# Patient Record
Sex: Female | Born: 1980 | Race: White | Hispanic: No | Marital: Single | Smoking: Current every day smoker
Health system: Southern US, Community
[De-identification: ages and names within clinical notes are randomized; demographics above are authoritative.]

## PROBLEM LIST (undated history)

## (undated) DIAGNOSIS — Z789 Other specified health status: Secondary | ICD-10-CM

## (undated) HISTORY — PX: REDUCTION MAMMAPLASTY: SUR839

## (undated) HISTORY — PX: BREAST SURGERY: SHX581

## (undated) HISTORY — PX: TONSILLECTOMY: SUR1361

## (undated) HISTORY — PX: CHOLECYSTECTOMY: SHX55

---
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
## 2000-12-16 ENCOUNTER — Emergency Department (HOSPITAL_COMMUNITY): Admission: EM | Admit: 2000-12-16 | Discharge: 2000-12-16 | Payer: Self-pay | Admitting: *Deleted

## 2001-06-02 ENCOUNTER — Ambulatory Visit (HOSPITAL_COMMUNITY): Admission: RE | Admit: 2001-06-02 | Discharge: 2001-06-02 | Payer: Self-pay | Admitting: Obstetrics and Gynecology

## 2001-06-02 ENCOUNTER — Encounter: Payer: Self-pay | Admitting: Obstetrics and Gynecology

## 2001-07-31 ENCOUNTER — Observation Stay (HOSPITAL_COMMUNITY): Admission: EM | Admit: 2001-07-31 | Discharge: 2001-08-01 | Payer: Self-pay | Admitting: *Deleted

## 2002-05-12 ENCOUNTER — Other Ambulatory Visit: Admission: RE | Admit: 2002-05-12 | Discharge: 2002-05-12 | Payer: Self-pay | Admitting: Obstetrics and Gynecology

## 2003-10-15 ENCOUNTER — Observation Stay (HOSPITAL_COMMUNITY): Admission: AD | Admit: 2003-10-15 | Discharge: 2003-10-16 | Payer: Self-pay | Admitting: Obstetrics and Gynecology

## 2004-02-07 ENCOUNTER — Ambulatory Visit (HOSPITAL_COMMUNITY): Admission: AD | Admit: 2004-02-07 | Discharge: 2004-02-07 | Payer: Self-pay | Admitting: Obstetrics and Gynecology

## 2004-03-10 ENCOUNTER — Inpatient Hospital Stay (HOSPITAL_COMMUNITY): Admission: AD | Admit: 2004-03-10 | Discharge: 2004-03-13 | Payer: Self-pay | Admitting: Obstetrics & Gynecology

## 2005-04-12 ENCOUNTER — Inpatient Hospital Stay (HOSPITAL_COMMUNITY): Admission: EM | Admit: 2005-04-12 | Discharge: 2005-04-14 | Payer: Self-pay | Admitting: Emergency Medicine

## 2005-04-13 ENCOUNTER — Encounter (INDEPENDENT_AMBULATORY_CARE_PROVIDER_SITE_OTHER): Payer: Self-pay | Admitting: General Surgery

## 2006-06-06 ENCOUNTER — Inpatient Hospital Stay (HOSPITAL_COMMUNITY): Admission: AD | Admit: 2006-06-06 | Discharge: 2006-06-08 | Payer: Self-pay | Admitting: Obstetrics and Gynecology

## 2006-07-21 ENCOUNTER — Inpatient Hospital Stay (HOSPITAL_COMMUNITY): Admission: AD | Admit: 2006-07-21 | Discharge: 2006-07-22 | Payer: Self-pay | Admitting: Family Medicine

## 2006-09-04 ENCOUNTER — Emergency Department (HOSPITAL_COMMUNITY): Admission: EM | Admit: 2006-09-04 | Discharge: 2006-09-04 | Payer: Self-pay | Admitting: Emergency Medicine

## 2006-09-06 ENCOUNTER — Ambulatory Visit (HOSPITAL_COMMUNITY): Admission: RE | Admit: 2006-09-06 | Discharge: 2006-09-06 | Payer: Self-pay | Admitting: Family Medicine

## 2007-01-17 ENCOUNTER — Inpatient Hospital Stay (HOSPITAL_COMMUNITY): Admission: AD | Admit: 2007-01-17 | Discharge: 2007-01-19 | Payer: Self-pay | Admitting: Family Medicine

## 2007-03-17 ENCOUNTER — Ambulatory Visit: Payer: Self-pay | Admitting: Internal Medicine

## 2007-08-09 ENCOUNTER — Emergency Department (HOSPITAL_COMMUNITY): Admission: EM | Admit: 2007-08-09 | Discharge: 2007-08-09 | Payer: Self-pay | Admitting: Emergency Medicine

## 2008-02-16 IMAGING — CR DG CHEST 2V
2 series · 2 of 2 positions shown · non-contrast
Comparison: None.

CLINICAL DATA: Syncopal episodes. 
 CHEST - 2 VIEW:

[view not recorded (1 of 2)]
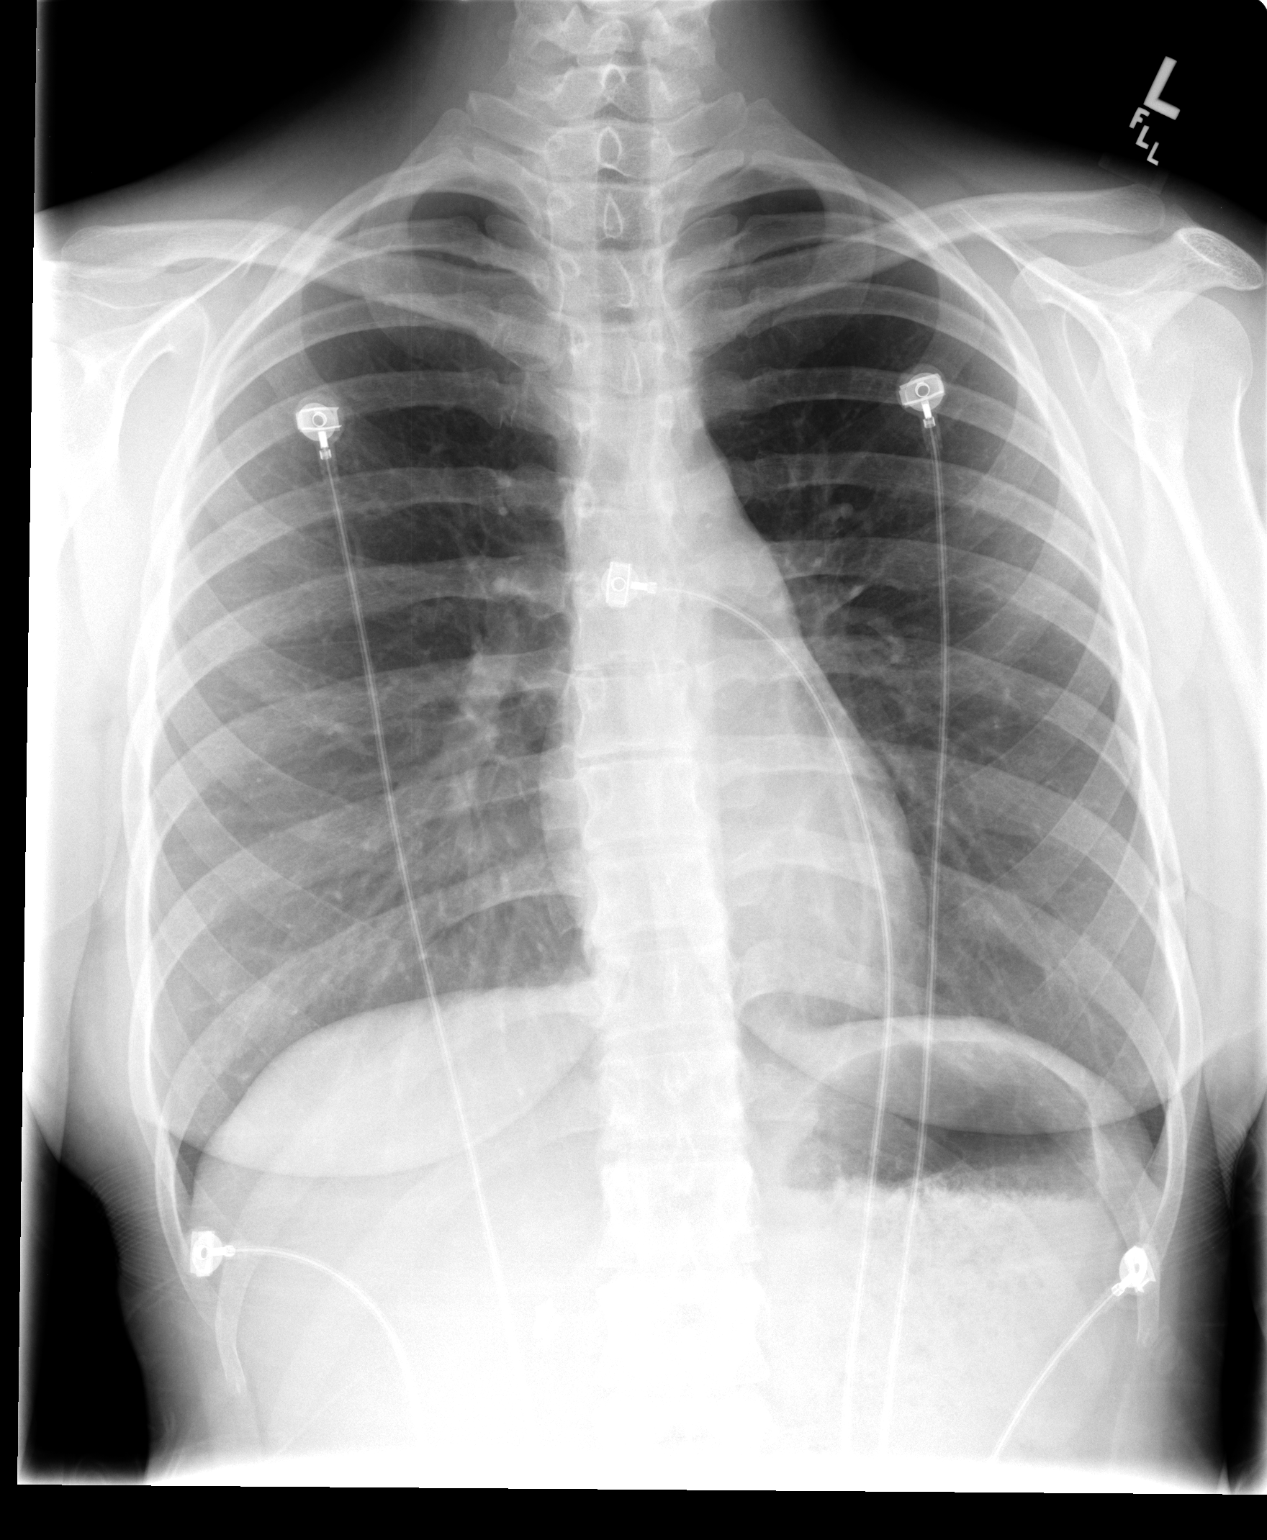

[view not recorded (2 of 2)]
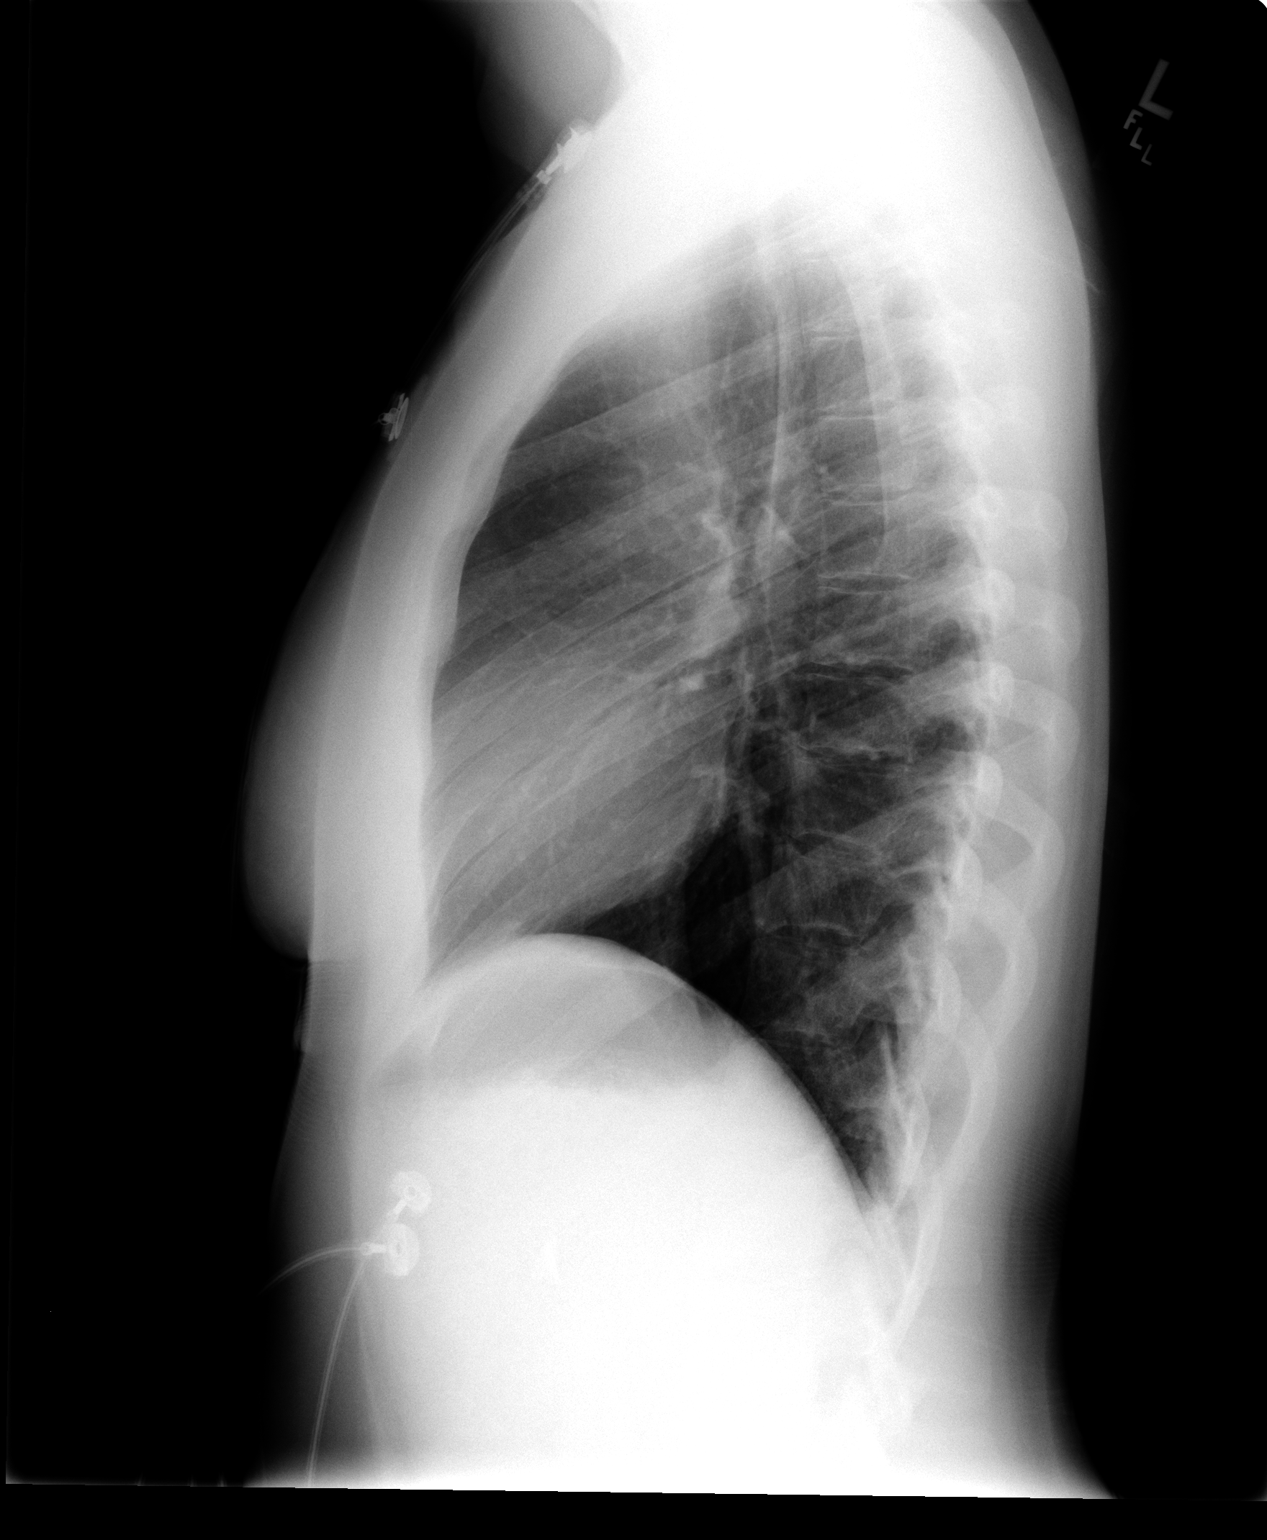

[2 of 2 positions shown; findings below may reference images not displayed]

FINDINGS: The heart size and mediastinal contours are within normal limits.  Both lungs are clear.  The visualized skeletal structures are unremarkable. Metallic clips right upper quadrant secondary to cholecystectomy.
IMPRESSION: No active cardiopulmonary disease.

## 2008-06-27 ENCOUNTER — Inpatient Hospital Stay (HOSPITAL_COMMUNITY): Admission: AD | Admit: 2008-06-27 | Discharge: 2008-06-28 | Payer: Self-pay | Admitting: Obstetrics & Gynecology

## 2008-06-27 ENCOUNTER — Ambulatory Visit: Payer: Self-pay | Admitting: Family Medicine

## 2008-09-14 ENCOUNTER — Other Ambulatory Visit: Admission: RE | Admit: 2008-09-14 | Discharge: 2008-09-14 | Payer: Self-pay | Admitting: Obstetrics and Gynecology

## 2009-10-02 ENCOUNTER — Other Ambulatory Visit: Admission: RE | Admit: 2009-10-02 | Discharge: 2009-10-02 | Payer: Self-pay | Admitting: Obstetrics & Gynecology

## 2009-11-28 ENCOUNTER — Ambulatory Visit (HOSPITAL_COMMUNITY): Admission: RE | Admit: 2009-11-28 | Discharge: 2009-11-28 | Payer: Self-pay | Admitting: Obstetrics & Gynecology

## 2010-07-06 ENCOUNTER — Emergency Department (HOSPITAL_COMMUNITY)
Admission: EM | Admit: 2010-07-06 | Discharge: 2010-07-06 | Payer: Self-pay | Source: Home / Self Care | Admitting: Family Medicine

## 2010-12-02 NOTE — Letter (Signed)
March 17, 2007    Dani Gobble, MD  [phone] [address]  [place], [place]. [address]   RE:  Aimee Webb  MRN:  [id number]  /  DOB:  1981-02-12   Dear Almira Bar,   Thank you for referring Aimee Webb for EP evaluation of syncope and  palpitations.  As you know, she is a very pleasant 30 year old woman who  has a history of tachy palpitations and is referred today for  evaluation.  The patient's history dates back to July.  She states that  she suddenly felt like her heart was racing.  There was no associated  syncope at that time, and she proceed to the emergency room.  In  transit, the heart racing eventually terminated, and she was admitted to  the hospital for evaluation under the care of Dr. Gareth Morgan.  By  report at that time, a 2D echo was carried out, which demonstrated some  LV dysfunction, which was not substantiated by stress Myoview study.  The patient subsequently had a cardiac monitor which demonstrated sinus  tachycardia at rates of up to 150 beats per minute.  She did not  describe any additional tachy palpitations.  The patient's next episode  occurred several weeks ago.  She was getting out of a car, became  diaphoretic, felt hot, and suddenly passed out.  She noted that on  awakening, she felt weak and tired and was again diaphoretic.  The  episode lasted for several minutes.  There was no loss of bowel or  bladder function.  She did not seek medical attention and eventually  felt better with her nausea resolving.  She is referred now for  additional evaluation.  Besides these two episodes, the patient had no  other specific symptoms.  She denies any heart failure complaints,  denies chest pain, or other cardiac symptoms.   PAST MEDICAL HISTORY:  Notable for a history of tonsillectomy in the  past.  She has a history of cholecystectomy in the past.  She has a  history of breast reduction in the past.  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   Her family history is notable for both parents being alive with no  significant medical problems.  Her grandfather does have heart disease.  She has one brother who is alive and well, and there is no family  history of syncope.   SOCIAL HISTORY:  The patient has a history of tobacco use and smokes a  half pack of cigarettes daily.  She denies recreational drug use.  She  does drink caffeinated beverages.   REVIEW OF SYSTEMS:  Noted in HPI.  Also, she has a history of some  dyspnea with exertion.   PHYSICAL EXAMINATION:  She is a pleasant, well-appearing 30 year old  woman in no acute distress.  Blood pressure was initially 98/67 with a heart rate of 88.  On  standing, her heart rate increased to 113 with essentially no change in  her blood pressure.  After five minutes of standing, her heart rate was  up to 118 with a blood pressure of 111/74.  She does not become dizzy or  pass out with this.  HEENT:  Normocephalic and atraumatic.  Pupils are equal and round.  Oropharynx is moist.  Sclerae are anicteric.  NECK:  No jugular venous distention.  There is no thyromegaly.  Trachea  is midline.  Carotids are 2+ and symmetric.  LUNGS:  Clear bilaterally to auscultation.  No wheezes, rales, or  rhonchi are present.  There is no increased work of breathing.  CARDIOVASCULAR:  Regular rate and rhythm with a normal S1 and S2.  There  are no murmurs, rubs or gallops.  PMI was not enlarged or laterally  displaced.  ABDOMEN:  Soft, nontender, nondistended.  There is no organomegaly.  The  bowel sounds are present with no rebound or guarding.  EXTREMITIES:  No clubbing, cyanosis or edema.  Pulses are 2+ and  symmetric.  NEUROLOGIC:  Alert and oriented x3.  Cranial nerves II-XII intact.  Strength is 5/5 and symmetric.   EKG demonstrated sinus tachycardia, otherwise normal.   [name], I think the most likely explantation of the patient's symptoms is  that she  has neurally mediated syncope.  She may also have a  supraventricular tachycardia.  The abnormal echo was a bit of a red  herring.  There is certainly no evidence on my exam that she has left  ventricular dysfunction or any heart failure symptoms, although I cannot  completely exclude a subclinical case of cardiomyopathy.  Rather, to  this end, I would recommend that we follow up her 2D echo in several  months.  With regard to her single episode of syncope, I have  recommended that she increase her salt and fluid intake.  I also tried  to educate she and her family about the importance of lying or sitting  down should she have an episode.  With regard to the tachycardic  palpitations, which she had only really one isolated episode, she may  well have supraventricular tachycardia in addition to neurally mediated  syncope, although I suspect that sinus tachycardia is the most likely  explantation to explain her palpitations, which often occur in patients  just prior to their passing out.  Her orthostatic vitals in my office  today further corroborate this, as she had an increase in her heart rate  of 30 beats per minute, going from the lying to the standing position at  five minutes.  I have recommended the patient in addition to increased  sodium and fluid intake, come back to the office and see me back in 2-3  months.  If at that time, she is doing well, then I will turn her back  over to your care and not require additional followup.  The exception of  this would be if she had what sounds more like supraventricular  tachycardia.   Thank you again for referring Aimee Webb for EP evaluation.    Sincerely,      Doylene Canning. Ladona Ridgel, MD  Electronically Signed    GWT/MedQ  DD: 03/17/2007  DT: 03/18/2007  Job #: [id number]

## 2010-12-02 NOTE — Group Therapy Note (Signed)
Aimee Webb, Aimee Webb               ACCOUNT NO.:  [id number]   MEDICAL RECORD NO.:  [id number]          PATIENT TYPE:  INP   LOCATION:  A216                          FACILITY:  [facility]   PHYSICIAN:  Aimee Webb, M.D.DATE OF BIRTH:  [birth year]   DATE OF PROCEDURE:  DATE OF DISCHARGE:                                 PROGRESS NOTE   SUBJECTIVE:  The patient feels better, but had trouble sleeping last  night.  There has been no more palpitations.   OBJECTIVE:  Temperature 97.8, pulse 84, respiratory rate of 18, blood  pressure 123/61.  On admission postural signs showed a supine blood  pressure 96/63 with a pulse of 76; a sitting blood pressure 107/61, the  pulse of 82; a standing blood pressure 104/61 with a pulse of 97.  Today  she is oriented and alert.  No acute distress.  Well-developed, well-  nourished.  Color is good.  HEART:  Irregular rhythm, rate of about 70.  LUNGS:  Clear throughout, moving air well.  There is no edema.   Her admission CBC, CMP and TSH were normal.   ASSESSMENT:  1. Tachycardia/palpitations, question etiology.  2. Orthostatic hypotension.   PLAN:  EKG reviewed and essentially normal.  Due to see Cardiologist  today.  Again, chief reason for admission is that the patient had  tachycardia and symptoms of pallor, not only when standing but also  supine, so it was felt that she really did have a basic tachycardia,  like a PAT, causing symptomatology.  She collapsed several times with  this was, so it was felt that making a diagnosis was imperative to avoid  further injury.      Aimee Webb, M.D.  Electronically Signed     SDK/MEDQ  D:  01/18/2007  T:  01/18/2007  Job:  [id number]

## 2010-12-02 NOTE — Procedures (Signed)
Aimee Webb, TESSMER               ACCOUNT NO.:  [id number]   MEDICAL RECORD NO.:  [id number]          PATIENT TYPE:  INP   LOCATION:  A216                          FACILITY:  [facility]   PHYSICIAN:  Dani Gobble, MD       DATE OF BIRTH:  March 14, 1981   DATE OF PROCEDURE:  01/19/2007  DATE OF DISCHARGE:  01/19/2007                                ECHOCARDIOGRAM   REFERRING PHYSICIAN:  Mila Homer. Sudie Bailey, M.D.   INDICATIONS:  30 year old female with a history of severe presyncope and  palpitations.   Technical aspect of study is reasonable.   The aorta measures normally at 2.4 cm.   Left atrium also measures normally at 2.8 cm.   The interventricular septum posterior wall within normal limits and  thickness, measured at 1.0 cm for each.   The aortic valve is trileaflet with normal opening.  No aortic  insufficiency is noted.  Doppler interrogation aortic valve is within  normal limits.   The mitral valve appears mildly thickened particularly on the distal  portion anterior leaflet.  No mitral valve prolapse is noted.  No  significant mitral regurgitation is noted.  Doppler interrogation mitral  valve is within normal limits.   Pulmonic valve is not well visualized.   Tricuspid valve appears grossly structurally normal with mild tricuspid  regurgitation noted.   Left ventricle is normal in size with LV IDD measured 3.7 cm, LV IC  measured 3.0 cm.  Overall left ventricular systolic function is  moderately diminished with an estimated ejection fraction of 35-45%.  There appears to be moderate global hypokinesis.  There does not appear  to be regionality to this dysfunction.   The right atrium, right ventricle normal in size, however, right  ventricular systolic function was not well evaluated.   IMPRESSION:  1. Mild tricuspid regurgitation.  2. Mildly thickened mitral valve particularly on the distal portion      anterior leaflet.  3. Normal left ventricular size with moderate  decrease in left      ventricular systolic function with EF estimated 35-45% with      moderate global hypokinesis.           ______________________________  Dani Gobble, MD     AB/MEDQ  D:  01/19/2007  T:  01/20/2007  Job:  [id number]   cc:   Mila Homer. Sudie Bailey, M.D.  Fax: [phone]

## 2010-12-02 NOTE — Op Note (Signed)
Aimee Webb, Aimee Webb               ACCOUNT NO.:  [id number]   MEDICAL RECORD NO.:  [id number]          PATIENT TYPE:  INP   LOCATION:  9109                          FACILITY:  [facility]   PHYSICIAN:  Tanya S. Shawnie Pons, M.D.   DATE OF BIRTH:  Feb 06, 1981   DATE OF PROCEDURE:  06/28/2008  DATE OF DISCHARGE:  06/28/2008                               OPERATIVE REPORT   PREOPERATIVE DIAGNOSES:  Multiparity and undesired fertility.   POSTOPERATIVE DIAGNOSES:  Multiparity and undesired fertility.   PROCEDURE:  Postpartum bilateral tubal ligation with Filshie clips.   SURGEON:  Shelbie Proctor. Shawnie Pons, MD   ASSISTANT:  None.   ANESTHESIA:  Epidural and local.   FINDINGS:  Normal tubes.   ESTIMATED BLOOD LOSS:  25 mL.   COMPLICATIONS:  None known.   REASON FOR PROCEDURE:  Briefly, the patient is a 30 year old para 3 who  desired permanent sterility.  She was counseled regarding risks and  benefits of this procedure, prior to proceed including permanency of the  procedure, risk of failure 1/100, and increased risk of ectopic.  The  patient understood these risks and agreed to proceed.   PROCEDURE IN DETAIL:  The patient was taken to the OR.  She was placed  in supine position.  She was prepped and draped in the usual sterile  fashion.  When anesthesia was felt to be adequate, a 1.5-cm incision was  made infraumbilically with a knife and was carried down to the  underlying fascia, and the peritoneal cavity was entered sharply.  The  patient was placed in Trendelenburg.  The patient's left tube was  identified, grasped with the Babcock clamp, and followed to its  fimbriated end.  A Filshie clip was placed approximately 2 cm from the  cornua under direct visualization.  The tube was allowed to return to  the abdominal cavity.  The patient's right tube was similarly found,  grasped with the Babcock clamp, and followed to its fimbriated end.  A  Filshie clip was placed across this tube, 2 cm from the  cornua under  direct visualization as well.  The ovary and the broad ligament was  noted underneath the tube that was felt to be hemostatic.  The tube was  allowed to return to the abdominal cavity.  The  fascia was then closed with 0 Vicryl suture in a running fashion and  subcuticular 3-0 Vicryl running was used to close the skin.  All  instrument and lap counts were correct x2.  The patient was awakened and  taken to recovery room in stable condition.      Shelbie Proctor. Shawnie Pons, M.D.  Electronically Signed     TSP/MEDQ  D:  06/28/2008  T:  06/28/2008  Job:  [id number]

## 2010-12-02 NOTE — Discharge Summary (Signed)
NAMEVERENIS, NICOSIA               ACCOUNT NO.:  [id number]   MEDICAL RECORD NO.:  [id number]          PATIENT TYPE:  INP   LOCATION:  A216                          FACILITY:  [facility]   PHYSICIAN:  Mila Homer. Sudie Bailey, M.D.DATE OF BIRTH:  1981/01/16   DATE OF ADMISSION:  01/17/2007  DATE OF DISCHARGE:  07/02/2008LH                               DISCHARGE SUMMARY   This 30 year old was admitted with a syncopal episode.  She had a benign  three-day hospitalization, extending from January 17, 2007, to January 19, 2007.  Vital signs remained stable during that time.   Her admission CBC and Met 7 were normal.  Electrocardiogram showed a  short PR interval.   She was seen by cardiology, Dr. Ermalinda Memos, who agreed that she probably  does have a cardiac arrhythmia, causing her symptomatology.   Arrangements were made at discharge for her to be seen by a cardiologist  and a 30-day event recorder placed and Dr. Ermalinda Memos was going to discuss  with her using the Valsalva maneuver until the rest of the data were  back.   FINAL DISCHARGE DIAGNOSES:  1. Syncopal episode with collapse.  2. Probable cardiac arrhythmia.      Mila Homer. Sudie Bailey, M.D.  Electronically Signed     SDK/MEDQ  D:  01/19/2007  T:  01/19/2007  Job:  [id number]

## 2010-12-02 NOTE — H&P (Signed)
NAMEMARILYNNE, Webb               ACCOUNT NO.:  [id number]   MEDICAL RECORD NO.:  [id number]          PATIENT TYPE:  INP   LOCATION:  A216                          FACILITY:  [facility]   PHYSICIAN:  Mila Homer. Sudie Bailey, M.D.DATE OF BIRTH:  Mar 11, 1981   DATE OF ADMISSION:  01/17/2007  DATE OF DISCHARGE:  LH                              HISTORY & PHYSICAL   This 30 year old was brought to the office today by her mother after she  was found collapsed on the floor of their kitchen.  Her mom, who is  trained as a CNA, found that she had a blood pressure of 54/32 with a of  pulse 54 and she was severely weakened, the lips and face had turned  white, she did not have the energy to get off the floor.   Apparently this has now happened four times.  In the nighttime she felt  weak and called her mom and mom found her just lying there, but  tachycardic.  Other times has had mostly standing up, and this time she  became suddenly so fatigued and weak that she had to lie down.   Currently is a single mother with two kids, ages 40 and 7 months.  Apparently there is a child custody case coming up.  In addition, there  have been problems with the patient losing weight, although she has not  been trying to.   She has generally been healthy.  She became hypokalemic last winter  after a diarrheal illness.  She was treated in the hospital for this.  She complains she is fatigued, legs feel weak, she aches and hurts all  over, and this has gone on for some time.  She notes each time she has  an attack it feels like her heart is beating out of her chest.   During both pregnancies, she stopped smoking.  Her last pregnancy  resulted in the delivery of a normal child in November 2007.  Since  then, she has started smoking again, but no more than two or three  cigarettes a day.  She does not feel depressed.   She has had no chest pain.  She will be short of breath during the  periods of palpitations but  otherwise has no known problems, she is not  coughing up sputum.  She has had no constipation or diarrhea or urinary  symptoms.   She is on no medications.  She ALLERGIC TO CODEINE, which causes nausea,  and PENICILLIN, which causes nausea.   PHYSICAL EXAMINATION IN THE OFFICE TODAY:  GENERAL:  A well-developed,  well-nourished, young woman.  She was oriented, alert, and in no acute  distress.  VITAL SIGNS:  Her weight is 124.5 pounds, down 9.5 pounds from January  and down 11.5 pounds from February.  Her blood pressure is 104/62, the  pulse is 92.  HEENT:  Her mucous membranes appear to be moist.  NECK:  There is no axillary or supraclavicular adenopathy.  HEART:  Regular rhythm and rate of 80.  LUNGS:  Clear throughout, she was moving air well.  There was  no  intercostal retraction or use of accessory muscles of respiration.  ABDOMEN:  Soft without hepatosplenomegaly or mass.  There was no CVA or  flank pain.  EXTREMITIES:  There is no edema of the ankles.   ADMISSION DIAGNOSES:  1. Syncope with near collapse.  2. Palpitations.  3. Tobacco use disorder.  4. Anxiety secondary to a custody dispute.   Given the fact this has now happened four times and each time she is  tachycardic without knowingly being stressed and as a result of it in  hypotension as checked by a trained and qualified observer, I am  admitting her to the hospital.  We are going to put her on a monitor and  have Cardiology see her to make sure she is not having any PAT or either  a cardiac arrhythmia.  I have discussed this with the patient and her  mom.      Mila Homer. Sudie Bailey, M.D.  Electronically Signed     SDK/MEDQ  D:  01/17/2007  T:  01/17/2007  Job:  [id number]

## 2010-12-05 NOTE — H&P (Signed)
[facility]  Patient:    Aimee Webb, Aimee Webb Visit Number: [id number] MRN: [id number]          Service Type: OBV Location: 3A A301 01 Attending Physician:  Aimee Webb Dictated by:   Aimee Webb, M.D. Admit Date:  07/31/2001                           History and Physical  OBSERVATION  REASON FOR OBSERVATION:  Dehydration.  HISTORY OF PRESENT ILLNESS:  Aimee Webb is a 30 year old who has had a recent problem with a relationship and has been depressed.  According to her mother, she has not eaten or drank, she says, anything for nine days.  She was seen by Aimee Webb, started on Paxil and Xanax, but it seems to have made very little difference.  She has not been active.  She has simply been lying in bed.  She was brought to the emergency room for evaluation and was evaluated by the ACT team, who felt that she should have inpatient treatment at the [facility] but, at this point, her mother would like her to have IV fluids overnight and be evaluated by Aimee Webb in the morning.  PAST MEDICAL HISTORY:  Essentially negative otherwise.  MEDICATIONS:  Paxil and Xanax are the only medications other than oral contraceptives.  SOCIAL HISTORY:  She lives at home.  She does not smoke.  She does not drink any alcohol.  FAMILY HISTORY:  Positive for depression.  REVIEW OF SYSTEMS:  Except as mentioned is negative.  PHYSICAL EXAMINATION:  GENERAL:  Thin, obviously depressed female who is in no acute distress.  VITAL SIGNS:  Blood pressure 120/70, pulse 80, afebrile.  HEENT:  Mucous membranes are slightly dry.  Nose and throat are clear. Tympanic membranes are intact.  NECK:  Supple.  CHEST:  Clear.  Without wheezes, rales, or rhonchi.  HEART:  Regular rhythm.  Without murmur, gallop, or rub.  ABDOMEN:  Soft.  Without masses.  EXTREMITIES:  No edema.  LABORATORY DATA:  BUN and creatinine are actually normal.  She has  ketones in her urine suggesting dehydration, as does her physical examination.  ASSESSMENT:  She is dehydrated.  She is very depressed.  PLAN:  IV fluids overnight.  Reevaluation by Aimee Webb tomorrow. Dictated by:   Aimee Webb, M.D. Attending Physician:  Aimee Webb DD:  07/31/01 TD:  07/31/01 Job: [id number]

## 2010-12-05 NOTE — Op Note (Signed)
Aimee Webb, Aimee Webb               ACCOUNT NO.:  [id number]   MEDICAL RECORD NO.:  [id number]          PATIENT TYPE:  INP   LOCATION:  LDR1                          FACILITY:  [facility]   PHYSICIAN:  Tilda Burrow, M.D. DATE OF BIRTH:  02/13/1981   DATE OF PROCEDURE:  06/06/2006  DATE OF DISCHARGE:                                PROCEDURE NOTE   TIME OF DELIVERY:  9 p.m.   LABOR SUMMARY AND DELIVERY NOTE:  Aimee Webb was admitted shortly before  noon and had Foley bulb in place until 5 p.m., at which time it was  removed.  At 7 p.m., membrane rupture was performed with cervix 3 cm,  50%, -2, with amniotomy revealing clear fluid.  She progressed rapidly  and developed enough pain to need an epidural.  She was given a fluid  bolus and progressed rapidly and was 8-9 cm quickly, but with a very  thick anterior lip.  When she sat up for the epidural, she quickly an  irresistible urge.  We had prepped the back, draped it, applied local  anesthesia, but we were unable to put the epidural in due to the rapid  progress and the bony obstruction when we first placed the Tuohy needle;  we only went about 2 cm in and never reached the epidural space.  When  we saw that she was having irresistible grunting urge, we stopped the  procedure, placed her down and let her proceed toward delivery.  She  progressed over the next 2-3 minutes, reaching crowning position,  delivering in a controlled fashion over an intact perineum, but had  laceration, median and second degree, that occurred with the delivery.  The right shoulder was slightly impacted.  The legs were flexed forward  in McRoberts position throughout the delivery and kept in that position  for the release of the mild dystocia.  The baby easily rotated because  after we could reach the posterior shoulder and spin the baby  counterclockwise to release the left arm from the introitus, released  the entire left arm, then delivered the rest of the body  over the  perineum with a second-degree laceration occurring.  The patient  tolerated the procedure well, was given Nubain post procedure with local  anesthesia.  Cord blood samples were obtained.  The infant had a loose  nuchal cord x1 that had been released prior to delivery of the fetal  body.   The placenta was delivered intact, Schultze presentation.  Amniotic  fluid was clear.  There was no meconium.  The perineum was cleansed, a  clean field prepared and then local anesthesia inserted and then a  continuous running 2-0 Vicryl used in 2-layer fashion to close the  perineum and skin irregularities were trimmed.  The patient tolerated  the procedure well and went to the recovery room in good condition.      Tilda Burrow, M.D.  Electronically Signed    JVF/MEDQ  D:  06/06/2006  T:  06/07/2006  Job:  [id number]   cc:   [facility]   Francoise Schaumann. Halm, DO,  [facility]  Fax: [phone]

## 2010-12-05 NOTE — Op Note (Signed)
NAME:  Aimee Webb, Aimee Webb                         ACCOUNT NO.:  [id number]   MEDICAL RECORD NO.:  [id number]                   PATIENT TYPE:  INP   LOCATION:  LDR1                                 FACILITY:  [facility]   PHYSICIAN:  Lazaro Arms, M.D.                DATE OF BIRTH:  1981/06/24   DATE OF PROCEDURE:  03/11/2004  DATE OF DISCHARGE:                                 OPERATIVE REPORT   PROCEDURE:  Epidural placement.   Tawanna is a 30 year old white female, gravida 1, para 0, at 39-6/[redacted] weeks  gestation, who had a Foley bulb placed last night and a Pitocin induction,  and she is contracting regularly and has already gotten 1 dose of IV pain  medicine.  The patient is requesting an epidural be placed.   The patient is placed in a sitting position.  The L3-L4 interspace  identified, Betadine prep is used.  Lidocaine 1% is used as a local  anesthetic.  A 17 gauge Tuohy needle was used in and a loss of resistance  technique employed, and the epidural space is found without difficulty.  Then 10 mL of 0.125% bupivacaine plain is given as a test dose without ill  effects.  An additional 10 mL of 0.125% bupivacaine is given after the  epidural catheter is threaded to dose up the epidural.  It is taped down 5  cm into the epidural space.  The patient tolerated it well with no ill  effects.  It is taped down securely on her back, and a continuous infusion  of 0.125% bupivacaine with 2 mcg/mL of fentanyl was begun at 12 mL/hr.  The  patient is getting comfortable, and fetal heart rate tracing is stable.      ___________________________________________                                            Lazaro Arms, M.D.   LHE/MEDQ  D:  03/11/2004  T:  03/11/2004  Job:  [id number]

## 2010-12-05 NOTE — Group Therapy Note (Signed)
Aimee Webb, Aimee Webb               ACCOUNT NO.:  [id number]   MEDICAL RECORD NO.:  [id number]          PATIENT TYPE:  EMS   LOCATION:  ED                            FACILITY:  [facility]   PHYSICIAN:  Mila Homer. Sudie Bailey, M.D.DATE OF BIRTH:  [birth year]   DATE OF PROCEDURE:  DATE OF DISCHARGE:                                 PROGRESS NOTE   The patient became quite febrile with a bad sore throat, coughing all  night over the last couple of days. She had not had a flu shot. Both  children have also been sick. She was recently hospitalized with a viral  illness as well.   She says that she has had no bad taste in her mouth, no clear rhinorrhea  but just was coughing last night, that kept her up. She has had some  achiness in her bones.   OBJECTIVE:  GENERAL: She is mildly toxic. Temperature is 99.3, the blood  pressure 114/75, pulse 123, respiratory rate 20.  HEENT: The tympanic membranes are absolutely gray. The pharynx appears  normal. There are negative anterior nodes. No maxillary or frontal sinus  pain.  LUNGS: Are clear throughout moving air well without intercostal  retractions or use of accessory muscles of respiration.  HEART: Regular rhythm at about 80.   The influenza A and B tests and nasal washings were negative. White cell  count was 11,100 or which 83% neutrophils, 11 lymphs, H&H is 13.4/40.6,  MCV of 87.   ASSESSMENT:  Bronchitis/pharyngitis.   PLAN:  Levaquin 500 mg daily x10 days (#10, no refills); Ibuprofen 800  mg t.i.d. for aching (#30, no refills).  A quick test for strep is pending. Discussed with patient.      Mila Homer. Sudie Bailey, M.D.  Electronically Signed     SDK/MEDQ  D:  09/04/2006  T:  09/04/2006  Job:  [id number]

## 2010-12-05 NOTE — Consult Note (Signed)
NAMERENADA, CRONIN               ACCOUNT NO.:  [id number]   MEDICAL RECORD NO.:  [id number]          PATIENT TYPE:  INP   LOCATION:  A318                          FACILITY:  [facility]   PHYSICIAN:  Barbaraann Barthel, M.D. DATE OF BIRTH:  02/03/1981   DATE OF CONSULTATION:  04/12/2005  DATE OF DISCHARGE:                                   CONSULTATION   SURGICAL CONSULTATION AND ADMISSION HISTORY AND PHYSICAL:  Note, surgery was  asked to see this 30 year old white female for abdominal pain.   CHIEF COMPLAINT:  Abdominal pain with nausea and vomiting.   HISTORY OF PRESENT MEDICAL ILLNESS:  The patient stated that she had  postprandial right upper quadrant pain radiating to her back accompanied by  nausea and vomiting approximately a week ago.  This subsided and the very  same symptoms reoccurred today, at approximately noon, after lunch.  This  was accompanied with several bouts, at least 5 bouts, of nausea and  vomiting.  She was admitted to the emergency room where she was evaluated  and a CT scan was performed and this revealed some edema around the  gallbladder by CT scan.  Sonogram was not performed.  Liver function studies  and lipase were within normal limits.  Her white count was 21,000 with 80%  neutrophils noted.  Her electrolytes and, as stated, her liver function  studies were within normal limits.   PHYSICAL EXAMINATION:  Discloses a pleasant 30 year old white female who is  uncomfortable but in no acute distress.  She is approximately 5 feet 6  inches tall, she weighs 130 pounds.  Her temperature is 97.9, pulse is 72  per minute, respirations 18 and her blood pressure was 111/64.   PHYSICAL EXAMINATION AS FOLLOWS:  HEAD:  Is normocephalic.  EYES:  Extraocular movements are intact.  Pupils are round and react to  light and accommodation.  There is no conjunctive pallor or scleral  injection.  Sclerae are normal tincture.  NOSE AND ORAL MUCOSA:  Are moist at present.  She  has been hydrated somewhat  in the emergency room.  NECK:  Is supple and cylindrical.  There is no jugular venous distention,  thyromegaly or tracheal deviation or any adenopathy.  CHEST:  Clear both to anterior and posterior auscultation.  HEART:  Regular rhythm.  LUNGS:  Are clear both to anterior and posterior auscultation.  BREASTS AND AXILLA:  Are without masses.  The patient has had a previous  breast reduction surgery.  ABDOMEN:  Tender with guarding in the right upper quadrant, bowel sounds are  present.  The patient has no femoral or inguinal hernias.  RECTAL EXAMINATION:  Guaiac negative stools.  EXTREMITIES:  Are within normal limits.   REVIEW OF SYSTEMS:  GI SYSTEM:  History of recurrent right upper quadrant  postprandial pain with nausea and vomiting.  No past history of hepatitis,  no past history of bright red rectal bleeding or black tarry stools,  although she has had some problems with hemorrhoids in the past.  She  suffers from some constipation.  She has no unexplained weight loss.  OB  HISTORY:  She is a gravida 1, para 1, abortus 0, cesarean 0 female whose  child is 25 months old.  Her last menstrual period was two weeks ago.  Her  hCG pregnancy test in the emergency room was negative.  CARDIORESPIRATORY  SYSTEM:  Within normal limits.  The patient occasionally smokes a cigarette  or two, she states.  No history of alcohol abuse.  GU SYSTEM:  No dysuria,  no past history of nephrolithiasis.  Her urinalysis was grossly within  normal limits.  ENDOCRINE SYSTEM:  No history of diabetes or thyroid  disease.  MUSCULOSKELETAL SYSTEM:  Within normal limits.  She did undergo a  breast reduction because of chronic back pain relating to her large breasts.   PAST SURGERIES:  Included breast reduction and a tonsillectomy, both  performed when she was around 30 years old.   She takes no medications on a regular basis, SHE IS ALLERGIC TO CODEINE.   Impression, therefore:   1.  Acute cholecystitis.  2.  Dehydration secondary to nausea and vomiting due to #1.   PLAN:  We will obtain a sonogram first thing in the morning.  She is n.p.o.  She has been placed on antibiotics and hydration has been initiated and we  will plan for surgery during this admission.   We discussed laparoscopic and open cholecystectomy with her in detail, her  mother has been a patient of mine and I had to perform an open  cholecystectomy on her.  All questions were answered.  We discussed  complications not limited to, but including, bleeding, infection, damage to  bile ducts, perforation of organs and transitory diarrhea and the  possibility that open surgery may be required.  Informed consent was  obtained.      Barbaraann Barthel, M.D.  Electronically Signed     WB/MEDQ  D:  04/12/2005  T:  04/13/2005  Job:  [id number]   cc:   Chart

## 2010-12-05 NOTE — Op Note (Signed)
Aimee Webb, Aimee Webb               ACCOUNT NO.:  [id number]   MEDICAL RECORD NO.:  [id number]          PATIENT TYPE:  INP   LOCATION:  A318                          FACILITY:  [facility]   PHYSICIAN:  Barbaraann Barthel, M.D. DATE OF BIRTH:  [birth year]   DATE OF PROCEDURE:  04/13/2005  DATE OF DISCHARGE:  04/14/2005                                 OPERATIVE REPORT   This is the second dictation for as the first dictation was apparently lost  by the computer system.   DIAGNOSES:  1.  Acute cholecystitis.  2.  Gallbladder with gravel.   PROCEDURE:  Laparoscopic cholecystectomy.   SURGEON:  Barbaraann Barthel, M.D.   This is a 30 year old white female who was admitted with nausea, vomiting,  and right upper quadrant pain. She was also dehydrated with an elevated  white count. Hydration and  antibiotics were initiated. She was taken to  surgery after this was corrected.   GROSS OPERATIVE FINDINGS:  Consistent with acute cholecystitis with some  small amount of adhesions and edema.   TECHNIQUE:  The patient was placed in a supine position. After, the adequate  administration of general anesthesia via endotracheal intubation, her entire  abdomen was prepped with Betadine solution and draped in the usual manner.  With the patient in Trendelenburg, a periumbilical incision was carried out  on the superior aspect of the umbilicus. A Veress needle was inserted. After  elevating the fascia and confirming its position with a saline drop test,  the abdomen was then insufflated with approximately 3.5 liters of CO2. An 11-  mm cannula was then placed through the umbilicus incision, and then under  direct vision another 11-mm cannula was placed in the epigastrium, and two 5-  mm cannulas were placed in the right upper quadrant laterally. The  gallbladder was grasped. Its adhesions were taken down. The cystic duct was  clearly visualized, triply silver clipped, and divided as was the cystic  artery.  The gallbladder was then removed uneventfully using the [facility] cautery  device and exited from the epigastric incision in the EndoSac device.  I  placed Surgicel within the liver bed and elected to place a Jackson-Pratt  drain. This drain exited through the lateral-most incision of the 5-mm  cannula site. After checking for hemostasis and irrigating, the abdomen was  then desufflated, and the fascia was closed in the area of the epigastrium  and the umbilicus with O Polysorb sutures, and 0.5% Sensorcaine was used to  anesthetize all incision sites for postoperative comfort. The  drain was sutured in place with 3-0 nylon, and the other incisions were  closed with a stapling device. Prior to closure, all sponge, needle, and  instrument counts were found to be correct. Estimated blood loss was  minimal. The patient received 1300 cc of crystalloid intraoperatively. There  were no complications.      Barbaraann Barthel, M.D.  Electronically Signed     WB/MEDQ  D:  05/04/2005  T:  05/04/2005  Job:  [id number]

## 2010-12-05 NOTE — Discharge Summary (Signed)
[name], Aimee Webb               ACCOUNT NO.:  [id number]   MEDICAL RECORD NO.:  [id number]          PATIENT TYPE:  INP   LOCATION:  A322                          FACILITY:  [facility]   PHYSICIAN:  Mila Homer. Sudie Bailey, M.D.DATE OF BIRTH:  May 16, 1981   DATE OF ADMISSION:  07/21/2006  DATE OF DISCHARGE:  01/03/2008LH                               DISCHARGE SUMMARY   This 30 year old was admitted to the hospital with dehydration and a  viral gastroenteritis.  She had a benign 1-1/2-day hospitalization  extending from 07/21/2006 to 07/22/2006.  Vital signs remained stable.   Her admission white cell count was 11,300 of which 93% neutrophils, 4%  lymphs and MET-7 showed a potassium of 3.2, glucose 102.  Her urine  showed a turbid colored urine but specific gravity greater than 1.030  and the rotavirus antigen was negative.   She was admitted to hospital, given IV normal saline at 1 liter in 2  hours, and 2 mL an hour for 5 hours, and 150 mL an hour.  She was given  Zofran 8 mg IV q.6 h. p.r.n. nausea and vomiting.  By the following day  she was much improved well-hydrated heart rate down to the 70s and  mucous membranes moist.  She looked much better; and she had no further  vomiting or diarrhea.   FINAL DIAGNOSIS:  1. Dehydration.  2. Viral gastroenteritis.  3. Hypokalemia.  We will replaced potassium with KCl 20 mEq b.i.d. for      5 days, #10 no refills.  She is discharged from the hospital but will be staying with her 6-year-  old and 60-year-old; both of whom were hospitalized with the same  symptoms.   FOLLOWUP:  In the office in 1 week.      Mila Homer. Sudie Bailey, M.D.  Electronically Signed     SDK/MEDQ  D:  07/22/2006  T:  07/22/2006  Job:  [id number]

## 2010-12-05 NOTE — H&P (Signed)
NAME:  Aimee Webb, Aimee Webb                   ACCOUNT NO.:  0   MEDICAL RECORD NO.:  [id number]           PATIENT TYPE:   LOCATION:                                FACILITY:  [facility]   PHYSICIAN:  Tilda Burrow, M.D. DATE OF BIRTH:  06-Nov-1980   DATE OF ADMISSION:  06/03/2006  DATE OF DISCHARGE:  LH                                HISTORY & PHYSICAL   CHIEF COMPLAINT:  Induction of labor secondary to psychosocial reasons.  Her  mother is having knee surgery 2 days before her due date and she will need  to be able to help take care of her.   HISTORY OF PRESENT ILLNESS:  Aimee Webb is a 30 year old, G2, P86 with an EDC of  June 10, 2006, based on and 18-week ultrasound placing her at 102 weeks'  gestation.  This also correlates exactly with her last menstrual period and  a first-trimester ultrasound.  She began prenatal care at approximately 6  weeks' gestation and has had regular visits since then.  Her prenatal course  has basically been uneventful.   PRENATAL LABORATORY DATA:  Blood type B positive, rubella immune.  HBsAg,  HIV, HSV, RPR, gonorrhea, chlamydia and Group B Streptococci are all  negative.  Her 1-hour GTT was normal at 80.  Blood pressures have been 100-  120s/50s-80s.  Total weight gain has been about 25 pounds with appropriate  fundal height growth.  Her Pap smear was L-SIL with negative HPV.  Her  colposcopy was within normal limits so we will repeat her Pap smear about 6  weeks after delivery.   PAST MEDICAL HISTORY:  Benign.   SURGICAL HISTORY:  Positive for gallbladder, tonsils and a breast reduction  and a cold knife conization.   ALLERGIES:  CODEINE.   MEDICATIONS:  Prenatal vitamins.   SOCIAL HISTORY:  She is single.  Father of the baby is aware of the  pregnancy, but he is uninvolved.  Her mother is very supportive.   FAMILY HISTORY:  Negative.   PAST OBSTETRICAL HISTORY:  Had a 4-hour labor in August 2005, and had a  vaginal delivery of a 7 pound 5 ounce  female over epidural anesthesia by Dr.  Despina Hidden.   PHYSICAL EXAMINATION:  HEENT:  Within normal limits.  HEART:  Regular rate and rhythm.  LUNGS:  Lungs were clear.  ABDOMEN:  Abdomen is soft and nontender.  Fundal height 38 cm.  PELVIC:  Cervical exam 1-2 cm, 50% effaced, -1 station in vertex  presentation.  EXTREMITIES:  Legs are negative.   IMPRESSION:  1. Intrauterine pregnancy at 39 weeks.  2. Elective induction of labor secondary to psychosocial reasons.  3. Cervical favorability.   PLAN:  We will admit her on the evening of June 03, 2006, with a Foley  preinduction and planned Pitocin and AROM in the morning,      Jacklyn Shell, C.N.M.      Tilda Burrow, M.D.  Electronically Signed    FC/MEDQ  D:  05/27/2006  T:  05/27/2006  Job:  [id number]   cc:  Francoise Schaumann. Milford Cage DO, FAAP  Fax: [phone]

## 2010-12-05 NOTE — Group Therapy Note (Signed)
[facility]  Patient:    Aimee Webb, SANDFORD Visit Number: [id number] MRN: [id number]          Service Type: OBV Location: 3A A301 01 Attending Physician:  John Giovanni Dictated by:   John Giovanni, M.D. Proc. Date: 08/01/01 Admit Date:  07/31/2001 Discharge Date: 08/01/2001                               Progress Note  SUBJECTIVE:  This 30 year old was admitted to the hospital yesterday with dehydration due to stress, anxiety, and depression over a failed relationship. She is now on IV fluids.  She still was not hungry.  OBJECTIVE:  She is supine today, oriented, and alert, in no acute distress, well-developed, well-nourished.  Temperature 98.4 degrees, pulse 73, respirations 20, blood pressure 112/62.  The lungs are clear through. The heart has a regular rhythm and rate at about 70.  The abdomen is soft without hepatosplenomegaly or mass.  There is no tenderness.  Affect is mildly depressed.  ASSESSMENT 1. Depression. 2. Initiation, resulting in dehydration.  PLAN:  Decrease IV fluids to 75 cc an hour.  Add Zyprexa 5 mg q.d.  Once she is able to eat and drink, will be able to discharge home and follow up as an outpatient.  Continue the Paxil 20 mg q.d. that she is already on, along with Xanax 0.5 mg q.i.d. Dictated by:   John Giovanni, M.D. Attending Physician:  John Giovanni DD:  08/01/01 TD:  08/02/01 Job: [id number]

## 2010-12-05 NOTE — H&P (Signed)
NAME:  Aimee Webb, Aimee Webb                         ACCOUNT NO.:  [id number]   MEDICAL RECORD NO.:  [id number]                   PATIENT TYPE:  OIB   LOCATION:  A416                                 FACILITY:  [facility]   PHYSICIAN:  Tilda Burrow, M.D.              DATE OF BIRTH:  1980-12-05   DATE OF ADMISSION:  10/15/2003  DATE OF DISCHARGE:                                HISTORY & PHYSICAL   REASON FOR ADMISSION:  Nausea and vomiting of pregnancy at [redacted] weeks  gestation.   HISTORY OF PRESENT ILLNESS:  Aimee Webb is a 30 year old gravida 1 para 0 with  an EDC of 824 who called me last evening stating that she had been vomiting  all day long, unable to keep anything down. She was admitted to the hospital  where it was noted she had 3+ ketones. Medical history is positive for a  heart murmur and frequent UTIs. Surgical history is positive for a breast  reduction, cryo, and tonsils.   ALLERGIES:  She is allergic to CODEINE.   MEDICATIONS:  She is taking prenatal vitamins.   PHYSICAL EXAMINATION:  VITAL SIGNS:  Temperature 97.2, pulse 75,  respirations 16, blood pressure 103/53. She has a trace of protein,  leukocytes, and 3+ ketones in her urine.   PRENATAL COURSE:  Prenatal course has been complicated by UTI and nausea and  vomiting of pregnancy. Blood type is B+, rubella is immune, hepatitis B  surface antigen negative, HIV is negative, serology is nonreactive. GC was  positive with test of cure was negative at that time.   PLAN:  We are going to admit, IV hydrate, give antiemetics, and attempt to  progress her diet as tolerated.     _____________________________________  ___________________________________________  Zerita Boers, N.M.                    Tilda Burrow, M.D.   DL/MEDQ  D:  16/04/9603  T:  10/16/2003  Job:  [id number]

## 2010-12-05 NOTE — Discharge Summary (Signed)
Aimee Webb, Aimee Webb               ACCOUNT NO.:  [id number]   MEDICAL RECORD NO.:  [id number]          PATIENT TYPE:  INP   LOCATION:  A318                          FACILITY:  [facility]   PHYSICIAN:  Barbaraann Barthel, M.D. DATE OF BIRTH:  [birth year]   DATE OF ADMISSION:  04/12/2005  DATE OF DISCHARGE:  09/26/2006LH                                 DISCHARGE SUMMARY   PROCEDURE:  Laparoscopic cholecystectomy on April 14, 2005.   HOSPITAL COURSE:  This is a 30 year old, white female who presented with  nausea and vomiting and right upper quadrant pain.  She had a CT scan which  showed some edema around the gallbladder, no stones and likewise she had  some early signs of acute inflammation by sonogram.  No stones were  appreciated.  She was taken to surgery after hydration and antibiotics were  initiated and, in fact, small gravel was found within the gallbladder.  She  had a laparoscopic cholecystectomy uneventfully on October 11, 2004.  She did  well postoperatively.  The following day she was tolerating p.o.  Her wounds  were clean.  She had no dysuria, leg pain or shortness of breath.  She was  discharged on the on postop day #1.   LABORATORY DATA:  She had a white count on admission of 21,000.  She was  quite dehydrated when she was admitted.  Her liver function studies were  within normal limits.  Prior to discharge her white count was 12.5 with H&H  of 10.6 and 31.0.  Her electrolytes were grossly within normal limits.  Her  liver function studies were also grossly within normal limits.  CT scan and  sonogram as mentioned above.   ACTIVITY:  The patient is excused from work.  She is told that she may  shower.  Increase her activity otherwise as tolerated.  No driving, no heavy  lifting or no sexual activity at present.   DISCHARGE MEDICATIONS:  Darvocet 1 tablet every 4 hours as needed.   SPECIAL INSTRUCTIONS:  No aspirin products.   FOLLOW UP:  I will follow her in my office.   Arrangements were made to see  her Tuesday, April 21, 2005, at 10:30 a.m..  For any surgical problems, she  is told to go to the emergency room in my absence or contact Dr. Lovell Sheehan who  will be covering me in my absence.      Barbaraann Barthel, M.D.  Electronically Signed    WB/MEDQ  D:  04/14/2005  T:  04/14/2005  Job:  [id number]   cc:   Dalia Heading, M.D.  Fax: [phone]

## 2010-12-05 NOTE — Op Note (Signed)
NAME:  OVA, GILLENTINE                         ACCOUNT NO.:  [id number]   MEDICAL RECORD NO.:  [id number]                   PATIENT TYPE:  INP   LOCATION:  A419                                 FACILITY:  [facility]   PHYSICIAN:  Lazaro Arms, M.D.                DATE OF BIRTH:  [birth year]   DATE OF PROCEDURE:  03/11/2004  DATE OF DISCHARGE:                                 OPERATIVE REPORT   Aimee Webb is a 31 year old white female, gravida 1, para 0. She progressed  very nicely through the first stage of labor and has been pushing now for  about 15 minutes.   Over an intact perineum, [name] delivers a viable female infant at 11:21 with  Apgars of 9 and 9, weight 7 pounds and 5 ounces. There was a 3-vessel cord.  Cord blood and cord gas were sent. Placenta was somewhat calcified but  otherwise normal and intact. The uterus was firm. There was a midline  vaginal perineal laceration that resulted that was repaired without  difficulty The uterus was firm below the umbilicus, and the bleeding is  appropriate. Blood loss from delivery is 250 cc. The infant will undergo  routine resuscitation and care.      ___________________________________________                                            Lazaro Arms, M.D.   LHE/MEDQ  D:  03/11/2004  T:  03/11/2004  Job:  [id number]

## 2010-12-05 NOTE — H&P (Signed)
NAMEHALLEL, DENHERDER               ACCOUNT NO.:  [id number]   MEDICAL RECORD NO.:  [id number]          PATIENT TYPE:  INP   LOCATION:  LDR1                          FACILITY:  [facility]   PHYSICIAN:  Tilda Burrow, M.D. DATE OF BIRTH:  10/14/1980   DATE OF ADMISSION:  06/06/2006  DATE OF DISCHARGE:  LH                              HISTORY & PHYSICAL   ADDENDUM:  Aimee Webb presents at mid day on June 06, 2006 for elective  induction of labor. She has been previously been scheduled for induction  by Dr. Cathie Beams and rescheduled due to staff being busy.  She is still requesting induction for the same psychosocial reasons and  we are attempting to accommodate her. She understands all the usual  risks of labor and management that can occur with induction. We have  discussed risks including distress for the infant, need for emergency  delivery and other complications of pregnancy. She requests and will  likely want this early. A Foley bulb has been inserted. We will leave it  in for 4 hours. Begin low dose Pitocin and do amniotomy after Foley bulb  is removed. See HPI dictated on May 27, 2006 by Drenda Freeze regarding  overall medical history. Cervix is 1 to 2, 50%, minus 1 to minus 2.  Foley bulb easily inserted. Will anticipate good prognosis for vaginal  delivery.   The patient is now 39 weeks and 3 days by menstrual and ultrasound  criteria. She had a prior induction with 4 hour labor, once Pitocin was  initiated, after Foley bulb placement overnight.      Tilda Burrow, M.D.  Electronically Signed     JVF/MEDQ  D:  06/06/2006  T:  06/06/2006  Job:  [id number]

## 2010-12-05 NOTE — H&P (Signed)
Aimee Webb, CAISSE               ACCOUNT NO.:  [id number]   MEDICAL RECORD NO.:  [id number]          PATIENT TYPE:  INP   LOCATION:  A322                          FACILITY:  [facility]   PHYSICIAN:  Mila Homer. Sudie Bailey, M.D.DATE OF BIRTH:  [birth year]   DATE OF ADMISSION:  07/21/2006  DATE OF DISCHARGE:  LH                              HISTORY & PHYSICAL   The patient is a 30 year old who developed nausea and vomiting and  diarrhea starting about 7 p.m. last night, almost 24 hours ago. She had  been unable to keep anything down. Appears to be weak and dizzy.  She is  generally healthy.  Last seen in this office about five years ago.  She  now has two children, one age 75, the other age six weeks.  Both of whom  had a viral-type disease and one required IV fluids in the [facility]  Emergency Room.   ALLERGIES:  The patient has no drug allergies.   She is an organ donor. She uses a subcutaneous estrogen delivery system  for birth control.   PHYSICAL EXAMINATION:  GENERAL:  Pleasant, young female who looks very  weak.  VITAL SIGNS:  Weight 134 pounds, height 64-1/4 inches, BP unreadable.  Pulse 130.  Rechecked by M.D. and the left radial pulse 140.  Temperature 98.  HEART:  Regular rhythm without murmur, rate of 140.  LUNGS:  Clear throughout.  ABDOMEN:  Soft without hepatosplenomegaly or mass.  Mild tenderness.  SKIN:  Skin turgor normal but mucous membranes were dry.  EXTREMITIES:  No edema of the ankles.   ADMISSION DIAGNOSES:  1. Dehydration.  2. Probably a viral gastroenteritis.   PLAN:  Direct admission to the hospital for IV fluids.  CBC, SMA12  pending as is a Rotavirus antibody from stool.  She will be given Zofran  8 mg IV q.6h. for nausea and vomiting.  No treatment for diarrhea at  present.      Mila Homer. Sudie Bailey, M.D.  Electronically Signed     SDK/MEDQ  D:  07/21/2006  T:  07/21/2006  Job:  [id number]

## 2010-12-05 NOTE — H&P (Signed)
NAME:  Aimee Webb                         ACCOUNT NO.:  [id number]   MEDICAL RECORD NO.:  [id number]                   PATIENT TYPE:  INP   LOCATION:  LDR1                                 FACILITY:  [facility]   PHYSICIAN:  Lazaro Arms, M.D.                DATE OF BIRTH:  01/02/1981   DATE OF ADMISSION:  03/10/2004  DATE OF DISCHARGE:                                HISTORY & PHYSICAL   Aimee Webb is a 30 year old white female, gravida 1, para 0, with estimated  delivery of March 12, 2004, currently at 39-5/[redacted] weeks gestation, who is  admitted for elective induction. The patient has been by her and her  mother's report miserable for the past several days, has not slept any, has  low back pain and pelvic pain. On exam in the office, she is 1 to 2, 25%, -2  station, vertex soft and posterior. We talked over the options, and the  patient is adamant about proceeding with elective induction if that is an  option. As a result, she is admitted for Foley bulb placement followed  Pitocin induction of labor tomorrow.   PAST MEDICAL HISTORY:  Multiple urinary tract infections.  1. She did have positive gonorrhea early in the pregnancy that was treated,     and repeat culture was negative at that time. She had another negative     culture at 36 weeks as well. Her group B strep culture was also negative.     Otherwise pregnancy has been uncomplicated.   PAST SURGICAL HISTORY:  She had a breast reduction.  1. She has had cryotherapy.  2. She has had tonsillectomy.   ALLERGIES:  CODEINE.   PAST OBSTETRICAL HISTORY:  She is multiparous.   REVIEW OF SYSTEMS:  Otherwise negative. Blood type is B positive. Antibody  screen is negative. Serology is nonreactive. Rubella is immune. Hepatitis B  negative. HIV is negative. Pap smear was normal. Her AFP was normal. Her HSV  titers at 28 weeks were negative. Her Glucola was barely normal at 139.   PHYSICAL EXAMINATION:  HEENT:  Unremarkable. Thyroid is  normal.  LUNGS:  Clear.  ABDOMEN:  Last fundal height in the office was 36 cm.  CERVIX:  1 to 2, 25, -2, soft, posterior vertex.  EXTREMITIES:  Warm with no edema.   IMPRESSION:  1. Intrauterine pregnancy at 39-5/[redacted] weeks gestation.  2. Elective induction.   PLAN:  The patient is admitted for Foley bulb ripening of cervix, following  with Pitocin induction of labor today. She is requesting an epidural when  she begins having regular uterine contractions.     ___________________________________________                                         Lazaro Arms, M.D.   Loraine Maple  D:  03/10/2004  T:  03/10/2004  Job:  [id number]

## 2011-01-14 ENCOUNTER — Ambulatory Visit (INDEPENDENT_AMBULATORY_CARE_PROVIDER_SITE_OTHER): Payer: Self-pay | Admitting: Internal Medicine

## 2011-04-09 LAB — RAPID URINE DRUG SCREEN, HOSP PERFORMED
Barbiturates: NOT DETECTED
Benzodiazepines: NOT DETECTED
Cocaine: NOT DETECTED
Opiates: NOT DETECTED

## 2011-04-09 LAB — DIFFERENTIAL
Basophils Absolute: 0
Eosinophils Absolute: 0.3
Eosinophils Relative: 3
Lymphocytes Relative: 38
Lymphs Abs: 4.1 — ABNORMAL HIGH
Monocytes Absolute: 0.7
Neutrophils Relative %: 52

## 2011-04-09 LAB — CBC
Hemoglobin: 12.1
MCV: 86.8
RDW: 14.6

## 2011-04-09 LAB — URINALYSIS, ROUTINE W REFLEX MICROSCOPIC
Bilirubin Urine: NEGATIVE
Nitrite: NEGATIVE
Specific Gravity, Urine: 1.02
Urobilinogen, UA: 0.2

## 2011-04-09 LAB — BASIC METABOLIC PANEL
BUN: 7
Calcium: 9.1
Chloride: 105
GFR calc Af Amer: >60

## 2011-04-09 LAB — URINE MICROSCOPIC-ADD ON

## 2011-04-09 LAB — ETHANOL: Alcohol, Ethyl (B): <5

## 2011-04-24 LAB — CBC
Hemoglobin: 10.1 g/dL — ABNORMAL LOW (ref 12.0–15.0)
MCV: 87.6 fL (ref 78.0–100.0)
Platelets: 232 10*3/uL (ref 150–400)
RBC: 3.45 MIL/uL — ABNORMAL LOW (ref 3.87–5.11)
RDW: 13.6 % (ref 11.5–15.5)

## 2011-05-05 LAB — BASIC METABOLIC PANEL
CO2: 25
Chloride: 109
Potassium: 4.2
Sodium: 140

## 2011-05-05 LAB — DIFFERENTIAL
Eosinophils Absolute: 0.2
Eosinophils Relative: 2
Lymphocytes Relative: 38
Lymphs Abs: 4 — ABNORMAL HIGH
Monocytes Absolute: 0.6
Monocytes Relative: 6
Neutro Abs: 5.7
Neutrophils Relative %: 54

## 2011-05-05 LAB — URINALYSIS, ROUTINE W REFLEX MICROSCOPIC
Bilirubin Urine: NEGATIVE
Ketones, ur: NEGATIVE
Nitrite: NEGATIVE
Protein, ur: NEGATIVE
Specific Gravity, Urine: 1.025
Urobilinogen, UA: 0.2

## 2011-05-05 LAB — LIPID PANEL
LDL Cholesterol: 103 — ABNORMAL HIGH
VLDL: 23

## 2011-05-05 LAB — RAPID URINE DRUG SCREEN, HOSP PERFORMED
Barbiturates: NOT DETECTED
Tetrahydrocannabinol: NOT DETECTED

## 2011-05-05 LAB — CBC
Platelets: 329
RDW: 15.2 — ABNORMAL HIGH
WBC: 10.5

## 2011-05-06 LAB — DIFFERENTIAL
Basophils Absolute: 0
Basophils Relative: 0
Lymphs Abs: 3.7 — ABNORMAL HIGH
Monocytes Absolute: 0.5
Monocytes Relative: 6

## 2011-05-06 LAB — TSH: TSH: 1.359

## 2011-05-06 LAB — COMPREHENSIVE METABOLIC PANEL
AST: 17
BUN: 9
Calcium: 9.5
GFR calc non Af Amer: >60
Potassium: 4
Sodium: 138

## 2011-05-06 LAB — CBC
Hemoglobin: 14.4
MCV: 83.9

## 2011-06-02 ENCOUNTER — Other Ambulatory Visit: Payer: Self-pay | Admitting: Obstetrics & Gynecology

## 2016-01-27 ENCOUNTER — Telehealth: Payer: Self-pay | Admitting: *Deleted

## 2016-01-27 NOTE — Telephone Encounter (Signed)
Needs appt to be seen before meds can be prescribed

## 2016-01-27 NOTE — Telephone Encounter (Signed)
Spoke with pt letting her know she needs to make an appt since it's been a few years since she was seen. Pt voiced understanding. JSY

## 2016-05-09 ENCOUNTER — Emergency Department (HOSPITAL_COMMUNITY)
Admission: EM | Admit: 2016-05-09 | Discharge: 2016-05-09 | Disposition: A | Discharge status: Home / Self Care | Payer: Self-pay | Attending: Emergency Medicine | Admitting: Emergency Medicine

## 2016-05-09 ENCOUNTER — Emergency Department (HOSPITAL_COMMUNITY): Payer: Self-pay

## 2016-05-09 ENCOUNTER — Encounter (HOSPITAL_COMMUNITY): Payer: Self-pay | Admitting: Emergency Medicine

## 2016-05-09 DIAGNOSIS — J329 Chronic sinusitis, unspecified: Secondary | ICD-10-CM | POA: Insufficient documentation

## 2016-05-09 DIAGNOSIS — F17210 Nicotine dependence, cigarettes, uncomplicated: Secondary | ICD-10-CM | POA: Insufficient documentation

## 2016-05-09 DIAGNOSIS — J209 Acute bronchitis, unspecified: Secondary | ICD-10-CM | POA: Insufficient documentation

## 2016-05-09 MED ORDER — CEPHALEXIN 500 MG PO CAPS
500.0000 mg | ORAL_CAPSULE | Freq: Once | ORAL | Status: AC
  Administered 2016-05-09: 500 mg via ORAL
  Filled 2016-05-09: qty 1

## 2016-05-09 MED ORDER — DEXAMETHASONE 4 MG PO TABS: 4.0000 mg | ORAL_TABLET | Freq: Two times a day (BID) | ORAL | 0 refills | Status: DC

## 2016-05-09 MED ORDER — ONDANSETRON HCL 4 MG PO TABS
4.0000 mg | ORAL_TABLET | Freq: Once | ORAL | Status: AC
  Administered 2016-05-09: 4 mg via ORAL
  Filled 2016-05-09: qty 1

## 2016-05-09 MED ORDER — CEPHALEXIN 500 MG PO CAPS: 500.0000 mg | ORAL_CAPSULE | Freq: Four times a day (QID) | ORAL | 0 refills | Status: DC

## 2016-05-09 MED ORDER — IBUPROFEN 800 MG PO TABS
800.0000 mg | ORAL_TABLET | Freq: Once | ORAL | Status: AC
  Administered 2016-05-09: 800 mg via ORAL
  Filled 2016-05-09: qty 1

## 2016-05-09 MED ORDER — LORATADINE-PSEUDOEPHEDRINE ER 5-120 MG PO TB12: 1.0000 | ORAL_TABLET | Freq: Two times a day (BID) | ORAL | 0 refills | Status: DC

## 2016-05-09 MED ORDER — PREDNISONE 20 MG PO TABS
40.0000 mg | ORAL_TABLET | Freq: Once | ORAL | Status: AC
  Administered 2016-05-09: 40 mg via ORAL
  Filled 2016-05-09: qty 2

## 2016-05-09 MED ORDER — HYDROCODONE-HOMATROPINE 5-1.5 MG/5ML PO SYRP: 5.0000 mL | ORAL_SOLUTION | Freq: Four times a day (QID) | ORAL | 0 refills | Status: DC | PRN

## 2016-05-09 MED ORDER — ALBUTEROL SULFATE HFA 108 (90 BASE) MCG/ACT IN AERS
2.0000 | INHALATION_SPRAY | Freq: Once | RESPIRATORY_TRACT | Status: AC
  Administered 2016-05-09: 2 via RESPIRATORY_TRACT
  Filled 2016-05-09: qty 6.7

## 2016-05-09 NOTE — ED Provider Notes (Signed)
[facility]EMER[facility]ENCY [facility] Provider Note   CSN: [id number] Arrival date & time: 05/09/16  1320     History   Chief Complaint Chief Complaint  Patient presents with  . Cough    HPI Aimee Webb is a 35 y.o. female.  Patient is a 35 year old female who presents to the emergency department with a complaint of cough.  Patient states that for the last 11 days she's been having problems with cough. She states this problem started with nasal congestion and cough. She had fever for one or 2 days, and then that got better. She now has a cough that is recurrent. She states that her chest is very sore as a result of her cough. She's not coughing up any blood, but she states that the phlegm changes color from time to time. She's not had any previous operations or procedures involving her chest. It is of note that she is a smoker.    Cough  Associated symptoms include myalgias.    History reviewed. No pertinent past medical history.  There are no active problems to display for this patient.   Past Surgical History:  Procedure Laterality Date  . CHOLECYSTECTOMY      OB History    Gravida Para Term Preterm AB Living   3 3 3          SAB TAB Ectopic Multiple Live Births                   Home Medications    Prior to Admission medications   Medication Sig Start Date End Date Taking? Authorizing Provider  cephALEXin (KEFLEX) 500 MG capsule Take 1 capsule (500 mg total) by mouth 4 (four) times daily. 05/09/16   Ivery Quale, PA-C  dexamethasone (DECADRON) 4 MG tablet Take 1 tablet (4 mg total) by mouth 2 (two) times daily with a meal. 05/09/16   Ivery Quale, PA-C  HYDROcodone-homatropine (HYCODAN) 5-1.5 MG/5ML syrup Take 5 mLs by mouth every 6 (six) hours as needed. 05/09/16   Ivery Quale, PA-C  loratadine-pseudoephedrine (CLARITIN-D 12 HOUR) 5-120 MG tablet Take 1 tablet by mouth 2 (two) times daily. 05/09/16   Ivery Quale, PA-C    Family History History reviewed. No  pertinent family history.  Social History Social History  Substance Use Topics  . Smoking status: Current Every Day Smoker    Packs/day: 0.25    Types: Cigarettes  . Smokeless tobacco: Never Used  . Alcohol use No     Allergies   Codeine   Review of Systems Review of Systems  HENT: Positive for congestion.   Respiratory: Positive for cough.   Musculoskeletal: Positive for myalgias.  All other systems reviewed and are negative.    Physical Exam Updated Vital Signs BP (!) 133/102 (BP Location: Left Arm)   Pulse 93   Temp 97.9 F (36.6 C) (Oral)   Resp 16   Ht 5\' 5"  (1.651 m)   Wt 63.5 kg   LMP 05/04/2016   SpO2 100%   BMI 23.30 kg/m   Physical Exam  Constitutional: She is oriented to person, place, and time. She appears well-developed and well-nourished.  Non-toxic appearance.  HENT:  Head: Normocephalic.  Right Ear: Tympanic membrane and external ear normal.  Left Ear: Tympanic membrane and external ear normal.  Eyes: EOM and lids are normal. Pupils are equal, round, and reactive to light.  Neck: Normal range of motion. Neck supple. Carotid bruit is not present.  Cardiovascular: Normal rate, regular rhythm, normal heart  sounds, intact distal pulses and normal pulses.   Pulmonary/Chest: Effort normal. No respiratory distress. She has wheezes.  Soft end expiratory wheezes present. Coarse breath sounds present throughout lung fields. There is symmetrical rise and fall of the chest.  Abdominal: Soft. Bowel sounds are normal. There is no tenderness. There is no guarding.  Musculoskeletal: Normal range of motion.  Lymphadenopathy:       Head (right side): No submandibular adenopathy present.       Head (left side): No submandibular adenopathy present.    She has no cervical adenopathy.  Neurological: She is alert and oriented to person, place, and time. She has normal strength. No cranial nerve deficit or sensory deficit.  Skin: Skin is warm and dry.  Psychiatric:  She has a normal mood and affect. Her speech is normal.  Nursing note and vitals reviewed.    ED Treatments / Results  Labs (all labs ordered are listed, but only abnormal results are displayed) Labs Reviewed - No data to display  EKG  EKG Interpretation None       Radiology Dg Chest 2 View  Result Date: 05/09/2016 CLINICAL DATA:  35 year old female with history of cough and chest congestion for the past 4 days. EXAM: CHEST  2 VIEW COMPARISON:  Chest x-ray 01/17/2007. FINDINGS: Lung volumes are normal. No consolidative airspace disease. No pleural effusions. No pneumothorax. No pulmonary nodule or mass noted. Pulmonary vasculature and the cardiomediastinal silhouette are within normal limits. IMPRESSION: No radiographic evidence of acute cardiopulmonary disease. Electronically Signed   By: Trudie Reedaniel  Entrikin M.D.   On: 05/09/2016 13:41    Procedures Procedures (including critical care time)  Medications Ordered in ED Medications  albuterol (PROVENTIL HFA;VENTOLIN HFA) 108 (90 Base) MCG/ACT inhaler 2 puff (not administered)  predniSONE (DELTASONE) tablet 40 mg (40 mg Oral Given 05/09/16 1417)  cephALEXin (KEFLEX) capsule 500 mg (500 mg Oral Given 05/09/16 1417)  ibuprofen (ADVIL,MOTRIN) tablet 800 mg (800 mg Oral Given 05/09/16 1417)  ondansetron (ZOFRAN) tablet 4 mg (4 mg Oral Given 05/09/16 1417)     Initial Impression / Assessment and Plan / ED Course  I have reviewed the triage vital signs and the nursing notes.  Pertinent labs & imaging results that were available during my care of the patient were reviewed by me and considered in my medical decision making (see chart for details).  Clinical Course    **I have reviewed nursing notes, vital signs, and all appropriate lab and imaging results for this patient.*  Final Clinical Impressions(s) / ED Diagnoses  Initial blood pressure is elevated at 133/102, the remainder the vital signs were within normal limits. The  examination favors bronchitis and sinusitis. The patient will be treated with Keflex, Decadron, albuterol, Claritin-D, and Hycodan. We will give the patient a mask to use until symptoms have resolved. We also discussed the need for good handwashing. Patient is in agreement with this discharge plan.   Repeat blood pressure at discharge was 115/75.    Final diagnoses:  Acute bronchitis, unspecified organism  Other sinusitis, unspecified chronicity    New Prescriptions New Prescriptions   CEPHALEXIN (KEFLEX) 500 MG CAPSULE    Take 1 capsule (500 mg total) by mouth 4 (four) times daily.   DEXAMETHASONE (DECADRON) 4 MG TABLET    Take 1 tablet (4 mg total) by mouth 2 (two) times daily with a meal.   HYDROCODONE-HOMATROPINE (HYCODAN) 5-1.5 MG/5ML SYRUP    Take 5 mLs by mouth every 6 (six) hours as needed.  LORATADINE-PSEUDOEPHEDRINE (CLARITIN-D 12 HOUR) 5-120 MG TABLET    Take 1 tablet by mouth 2 (two) times daily.     Ivery Quale, PA-C 05/09/16 1425    Lavera Guise, MD 05/10/16 [phone]

## 2016-05-09 NOTE — ED Notes (Signed)
Respiratory paged

## 2016-05-09 NOTE — Discharge Instructions (Signed)
Please increase fluids. Wash hands frequently. Use a mask until symptoms have resolved. Please use Decadron, Keflex, and albuterol and Claritin-D daily. Use Hycodan for cough area as medication may cause drowsiness, please use with caution.

## 2016-05-09 NOTE — ED Triage Notes (Signed)
Pt reports cough and congestion for 1 week. Pt states she had fever at first but none now.

## 2017-09-08 ENCOUNTER — Emergency Department (HOSPITAL_COMMUNITY)
Admission: EM | Admit: 2017-09-08 | Discharge: 2017-09-08 | Disposition: A | Discharge status: Home / Self Care | Payer: Self-pay | Attending: Emergency Medicine | Admitting: Emergency Medicine

## 2017-09-08 ENCOUNTER — Encounter (HOSPITAL_COMMUNITY): Payer: Self-pay | Admitting: Emergency Medicine

## 2017-09-08 ENCOUNTER — Other Ambulatory Visit: Payer: Self-pay

## 2017-09-08 DIAGNOSIS — N390 Urinary tract infection, site not specified: Secondary | ICD-10-CM | POA: Insufficient documentation

## 2017-09-08 DIAGNOSIS — M545 Low back pain: Secondary | ICD-10-CM | POA: Insufficient documentation

## 2017-09-08 DIAGNOSIS — R350 Frequency of micturition: Secondary | ICD-10-CM | POA: Insufficient documentation

## 2017-09-08 DIAGNOSIS — F17210 Nicotine dependence, cigarettes, uncomplicated: Secondary | ICD-10-CM | POA: Insufficient documentation

## 2017-09-08 LAB — URINALYSIS, ROUTINE W REFLEX MICROSCOPIC
BILIRUBIN URINE: NEGATIVE
Glucose, UA: NEGATIVE mg/dL
KETONES UR: 5 mg/dL — AB
NITRITE: NEGATIVE
PROTEIN: NEGATIVE mg/dL
Specific Gravity, Urine: 1.021 (ref 1.005–1.030)
pH: 7 (ref 5.0–8.0)

## 2017-09-08 LAB — POC URINE PREG, ED: PREG TEST UR: NEGATIVE

## 2017-09-08 MED ORDER — CEPHALEXIN 500 MG PO CAPS
500.0000 mg | ORAL_CAPSULE | Freq: Once | ORAL | Status: AC
  Administered 2017-09-08: 500 mg via ORAL
  Filled 2017-09-08: qty 1

## 2017-09-08 MED ORDER — IBUPROFEN 800 MG PO TABS
800.0000 mg | ORAL_TABLET | Freq: Once | ORAL | Status: AC
  Administered 2017-09-08: 800 mg via ORAL
  Filled 2017-09-08: qty 1

## 2017-09-08 MED ORDER — CEPHALEXIN 500 MG PO CAPS: 500.0000 mg | ORAL_CAPSULE | Freq: Four times a day (QID) | ORAL | 0 refills | Status: DC

## 2017-09-08 NOTE — ED Provider Notes (Signed)
[facility]Y [facility]PARTMENT Provider Note   CSN: [id number] Arrival date & time: 09/08/17  1858     History   Chief Complaint Chief Complaint  Patient presents with  . Abdominal Pain    HPI Aimee Webb is a 37 y.o. female.  HPI   Aimee Webb is a 37 y.o. female who presents to the Emergency Department complaining of low back pain, urinary frequency,  fever and chills.  Symptoms began last week.  She states that she felt as though she had a urinary tract infection, so she began taking over-the-counter AZO without relief.  She states that 3 days ago she noticed that her urine appeared dark and had an odor and she describes having dull pain to her vaginal area with voiding.  She also complains of nasal congestion, cough and generalized body aches.  Cough is mild and non-productive.  She denies vaginal discharge, abnormal vaginal bleeding, new sexual partners, and upper abdominal pain, vomiting and diarrhea.    History reviewed. No pertinent past medical history.  There are no active problems to display for this patient.   Past Surgical History:  Procedure Laterality Date  . CHOLECYSTECTOMY      OB History    Gravida Para Term Preterm AB Living   3 3 3          SAB TAB Ectopic Multiple Live Births                   Home Medications    Prior to Admission medications   Medication Sig Start Date End Date Taking? Authorizing Provider  cephALEXin (KEFLEX) 500 MG capsule Take 1 capsule (500 mg total) by mouth 4 (four) times daily. 05/09/16   Ivery QualeBryant, Hobson, PA-C  dexamethasone (DECADRON) 4 MG tablet Take 1 tablet (4 mg total) by mouth 2 (two) times daily with a meal. 05/09/16   Ivery QualeBryant, Hobson, PA-C  HYDROcodone-homatropine [facility](HYCODAN) 5-1.5 MG/5ML syrup Take 5 mLs by mouth every 6 (six) hours as needed. 05/09/16   Ivery QualeBryant, Hobson, PA-C  loratadine-pseudoephedrine (CLARITIN-D 12 HOUR) 5-120 MG tablet Take 1 tablet by mouth 2 (two) times daily. 05/09/16   Ivery QualeBryant,  Hobson, PA-C    Family History History reviewed. No pertinent family history.  Social History Social History   Tobacco Use  . Smoking status: Current Every Day Smoker    Packs/day: 0.25    Types: Cigarettes  . Smokeless tobacco: Never Used  Substance Use Topics  . Alcohol use: No  . Drug use: No     Allergies   Codeine   Review of Systems Review of Systems  Constitutional: Positive for chills and fever. Negative for activity change and appetite change.  Respiratory: Negative for chest tightness and shortness of breath.   Cardiovascular: Negative for chest pain.  Gastrointestinal: Negative for abdominal pain, nausea and vomiting.  Genitourinary: Positive for dysuria, frequency, urgency and vaginal pain. Negative for decreased urine volume, difficulty urinating, flank pain and hematuria.  Musculoskeletal: Positive for back pain.  Skin: Negative for rash.  Neurological: Negative for dizziness, weakness and numbness.  Hematological: Negative for adenopathy.  Psychiatric/Behavioral: Negative for confusion.  All other systems reviewed and are negative.    Physical Exam Updated Vital Signs BP 132/88 (BP Location: Left Arm)   Pulse (!) 118   Temp 98.7 F (37.1 C) (Oral)   Resp 17   Ht 5\' 5"  (1.651 m)   Wt 63.5 kg (140 lb)   LMP 08/29/2017   SpO2 97%  BMI 23.30 kg/m   Physical Exam  Constitutional: She is oriented to person, place, and time. She appears well-developed and well-nourished. She does not appear ill.  HENT:  Head: Normocephalic.  Mouth/Throat: Oropharynx is clear and moist.  Neck: Normal range of motion. Neck supple.  Cardiovascular: Normal rate, regular rhythm and normal heart sounds.  Pulmonary/Chest: Effort normal and breath sounds normal. No respiratory distress.  Abdominal: Soft. Normal appearance. She exhibits no distension and no mass. There is tenderness in the suprapubic area. There is no guarding and no CVA tenderness.  Musculoskeletal:  Normal range of motion.  Diffuse ttp of the lower bilateral lumbar paraspinal muscles.  No spinal tenderness. Neg SLR bilaterally  Neurological: She is alert and oriented to person, place, and time.  Skin: Skin is warm. Capillary refill takes less than 2 seconds. No rash noted.  Psychiatric: She has a normal mood and affect.  Nursing note and vitals reviewed.    ED Treatments / Results  Labs (all labs ordered are listed, but only abnormal results are displayed) Labs Reviewed  URINALYSIS, ROUTINE W REFLEX MICROSCOPIC - Abnormal; Notable for the following components:      Result Value   APPearance CLOUDY (*)    Hgb urine dipstick MODERATE (*)    Ketones, ur 5 (*)    Leukocytes, UA SMALL (*)    Bacteria, UA RARE (*)    Squamous Epithelial / LPF 6-30 (*)    All other components within normal limits  URINE CULTURE  POC URINE PREG, ED    EKG  EKG Interpretation None       Radiology No results found.  Procedures Procedures (including critical care time)  Medications Ordered in ED Medications  ibuprofen (ADVIL,MOTRIN) tablet 800 mg (not administered)  cephALEXin (KEFLEX) capsule 500 mg (not administered)     Initial Impression / Assessment and Plan / ED Course  I have reviewed the triage vital signs and the nursing notes.  Pertinent labs & imaging results that were available during my care of the patient were reviewed by me and considered in my medical decision making (see chart for details).     Patient well-appearing, non-toxic.  Vitals reviewed.  Likely UTI and viral URI. Doubt pyelonephritis.  Patient agrees to treatment plan with Keflex, urine culture is pending.  She agrees to alternate Tylenol and ibuprofen if needed for pain/fever.  She appears safe for discharge home, return precautions were discussed  Final Clinical Impressions(s) / ED Diagnoses   Final diagnoses:  Urinary tract infection in female    ED Discharge Orders    None       Pauline Aus, PA-C 09/09/17 0001    Rancour, Jeannett Senior, MD 09/09/17 [phone]

## 2017-09-08 NOTE — Discharge Instructions (Signed)
Drink plenty of water.  Take the antibiotic as directed until its finished.  Follow-up with your primary doctor for recheck or return to the ER for any worsening symptoms such as increasing pain, vomiting, and fever greater than 100.5

## 2017-09-08 NOTE — ED Triage Notes (Signed)
Pt c/o lower back/abd pain, frequent urination, chills and fever.

## 2017-09-11 LAB — URINE CULTURE: Culture: >=100000 — AB

## 2017-09-12 ENCOUNTER — Telehealth: Payer: Self-pay

## 2017-09-12 NOTE — Telephone Encounter (Signed)
Post ED Visit - Positive Culture Follow-up  Culture report reviewed by antimicrobial stewardship pharmacist:  []  Enzo BiNathan Batchelder, Pharm.D. []  Celedonio MiyamotoJeremy Frens, Pharm.D., BCPS AQ-ID []  Garvin FilaMike Maccia, Pharm.D., BCPS []  Georgina PillionElizabeth Martin, Pharm.D., BCPS []  BartlettMinh Pham, VermontPharm.D., BCPS, AAHIVP []  Estella HuskMichelle Turner, Pharm.D., BCPS, AAHIVP []  Lysle Pearlachel Rumbarger, PharmD, BCPS []  Blake DivineShannon Parkey, PharmD []  Pollyann SamplesAndy Johnston, PharmD, BCPS  [facility] Memorial HospitalBen Mancheril Pharm D Positive urine culture Treated with Cephalexin, organism sensitive to the same and no further patient follow-up is required at this time.  Jerry CarasCullom, Laurella Tull Burnett 09/12/2017, 8:50 AM

## 2018-05-27 ENCOUNTER — Emergency Department (HOSPITAL_COMMUNITY)
Admission: EM | Admit: 2018-05-27 | Discharge: 2018-05-27 | Disposition: A | Discharge status: Home / Self Care | Payer: Medicaid Other | Attending: Emergency Medicine | Admitting: Emergency Medicine

## 2018-05-27 ENCOUNTER — Encounter (HOSPITAL_COMMUNITY): Payer: Self-pay | Admitting: Emergency Medicine

## 2018-05-27 ENCOUNTER — Other Ambulatory Visit: Payer: Self-pay

## 2018-05-27 ENCOUNTER — Emergency Department (HOSPITAL_COMMUNITY): Payer: Medicaid Other

## 2018-05-27 DIAGNOSIS — N921 Excessive and frequent menstruation with irregular cycle: Secondary | ICD-10-CM | POA: Diagnosis not present

## 2018-05-27 DIAGNOSIS — R319 Hematuria, unspecified: Secondary | ICD-10-CM | POA: Insufficient documentation

## 2018-05-27 DIAGNOSIS — F17210 Nicotine dependence, cigarettes, uncomplicated: Secondary | ICD-10-CM | POA: Diagnosis not present

## 2018-05-27 DIAGNOSIS — N83209 Unspecified ovarian cyst, unspecified side: Secondary | ICD-10-CM

## 2018-05-27 DIAGNOSIS — N83202 Unspecified ovarian cyst, left side: Secondary | ICD-10-CM | POA: Diagnosis not present

## 2018-05-27 DIAGNOSIS — R1032 Left lower quadrant pain: Secondary | ICD-10-CM

## 2018-05-27 LAB — COMPREHENSIVE METABOLIC PANEL
ALBUMIN: 4.3 g/dL (ref 3.5–5.0)
ALT: 12 U/L (ref 0–44)
ANION GAP: 7 (ref 5–15)
AST: 16 U/L (ref 15–41)
Alkaline Phosphatase: 56 U/L (ref 38–126)
BUN: 7 mg/dL (ref 6–20)
CO2: 23 mmol/L (ref 22–32)
Calcium: 9.1 mg/dL (ref 8.9–10.3)
Chloride: 108 mmol/L (ref 98–111)
Creatinine, Ser: 0.62 mg/dL (ref 0.44–1.00)
GFR calc non Af Amer: >60 mL/min (ref >60)
GLUCOSE: 79 mg/dL (ref 70–99)
POTASSIUM: 3.9 mmol/L (ref 3.5–5.1)
Sodium: 138 mmol/L (ref 135–145)
TOTAL PROTEIN: 7.3 g/dL (ref 6.5–8.1)
Total Bilirubin: 0.4 mg/dL (ref 0.3–1.2)

## 2018-05-27 LAB — URINALYSIS, ROUTINE W REFLEX MICROSCOPIC
BILIRUBIN URINE: NEGATIVE
GLUCOSE, UA: NEGATIVE mg/dL
KETONES UR: NEGATIVE mg/dL
NITRITE: NEGATIVE
PH: 8 (ref 5.0–8.0)
Protein, ur: NEGATIVE mg/dL
SPECIFIC GRAVITY, URINE: 1.013 (ref 1.005–1.030)

## 2018-05-27 LAB — WET PREP, GENITAL
CLUE CELLS WET PREP: NONE SEEN
SPERM: NONE SEEN
Trich, Wet Prep: NONE SEEN
WBC WET PREP: NONE SEEN
Yeast Wet Prep HPF POC: NONE SEEN

## 2018-05-27 LAB — CBC WITH DIFFERENTIAL/PLATELET
Abs Immature Granulocytes: 0.02 10*3/uL (ref 0.00–0.07)
BASOS ABS: 0 10*3/uL (ref 0.0–0.1)
Basophils Relative: 0 %
EOS ABS: 0.4 10*3/uL (ref 0.0–0.5)
Eosinophils Relative: 4 %
HEMATOCRIT: 40.5 % (ref 36.0–46.0)
Hemoglobin: 12.6 g/dL (ref 12.0–15.0)
Immature Granulocytes: 0 %
LYMPHS ABS: 3.5 10*3/uL (ref 0.7–4.0)
LYMPHS PCT: 34 %
MCH: 27.4 pg (ref 26.0–34.0)
MCHC: 31.1 g/dL (ref 30.0–36.0)
MCV: 88 fL (ref 80.0–100.0)
Monocytes Absolute: 0.6 10*3/uL (ref 0.1–1.0)
Monocytes Relative: 6 %
NEUTROS PCT: 56 %
NRBC: 0 % (ref 0.0–0.2)
Neutro Abs: 5.6 10*3/uL (ref 1.7–7.7)
Platelets: 345 10*3/uL (ref 150–400)
RBC: 4.6 MIL/uL (ref 3.87–5.11)
RDW: 14.8 % (ref 11.5–15.5)
WBC: 10.2 10*3/uL (ref 4.0–10.5)

## 2018-05-27 LAB — LIPASE, BLOOD: Lipase: 29 U/L (ref 11–51)

## 2018-05-27 LAB — PREGNANCY, URINE: Preg Test, Ur: NEGATIVE

## 2018-05-27 MED ORDER — ONDANSETRON HCL 4 MG PO TABS: 4.0000 mg | ORAL_TABLET | Freq: Three times a day (TID) | ORAL | 0 refills | Status: DC | PRN

## 2018-05-27 MED ORDER — HYDROCODONE-ACETAMINOPHEN 5-325 MG PO TABS: 1.0000 | ORAL_TABLET | Freq: Four times a day (QID) | ORAL | 0 refills | Status: DC | PRN

## 2018-05-27 MED ORDER — IOPAMIDOL (ISOVUE-300) INJECTION 61%
100.0000 mL | Freq: Once | INTRAVENOUS | Status: AC | PRN
  Administered 2018-05-27: 100 mL via INTRAVENOUS

## 2018-05-27 MED ORDER — SODIUM CHLORIDE 0.9 % IV BOLUS
1000.0000 mL | Freq: Once | INTRAVENOUS | Status: AC
  Administered 2018-05-27: 1000 mL via INTRAVENOUS

## 2018-05-27 MED ORDER — MORPHINE SULFATE (PF) 4 MG/ML IV SOLN
4.0000 mg | Freq: Once | INTRAVENOUS | Status: AC
  Administered 2018-05-27: 4 mg via INTRAVENOUS
  Filled 2018-05-27: qty 1

## 2018-05-27 MED ORDER — ONDANSETRON HCL 4 MG/2ML IJ SOLN
4.0000 mg | Freq: Once | INTRAMUSCULAR | Status: AC
  Administered 2018-05-27: 4 mg via INTRAVENOUS
  Filled 2018-05-27: qty 2

## 2018-05-27 NOTE — ED Provider Notes (Signed)
Patient awake and alert, aware of all findings, including CT result consistent with ruptured ovarian cyst. Patient will follow-up with gynecology next week and was discharged in stable condition.   Gerhard Munch, MD 05/27/18 [phone]

## 2018-05-27 NOTE — Discharge Instructions (Addendum)
Evaluated in the emergency department for irregular heavy periods and new left lower abdominal pain.  You had blood work pelvic ultrasound and CAT scan.  We are prescribing you some pain medicine and nausea medicine to take.  He will be important for you to follow-up with your gynecologist as scheduled next week.  Please return if any worsening symptoms.

## 2018-05-27 NOTE — ED Provider Notes (Signed)
[facility] EMERGENCY DEPARTMENT Provider Note   CSN: [id number] Arrival date & time: 05/27/18  1252     History   Chief Complaint Chief Complaint  Patient presents with  . Hematuria    HPI Aimee Webb is a 37 y.o. female.  She presents today complaining of left lower quadrant abdominal pain for last 6 or 7 days.  She said it is getting progressively worse and causing her difficulty walking.  It sharp and stabbing in nature and no radiation.  She said she noticed one episode of blood in her urine.  She is also noticed for 1 year irregular heavy vaginal bleeding.  She also has dyspareunia for a year.  She has not had insurance until now so she was unable to see a gynecologist.  No fevers no chills no cough no chest pain no nausea no vomiting no constipation or diarrhea.  No vaginal discharge.  The history is provided by the patient and the spouse.  Abdominal Pain   This is a new problem. Episode onset: 6 days. The problem occurs constantly. The problem has not changed since onset.The pain is associated with an unknown factor. The pain is located in the LLQ and suprapubic region. The pain is severe. Associated symptoms include hematuria. Pertinent negatives include fever, diarrhea, hematochezia, melena, nausea, vomiting, constipation, dysuria, frequency and headaches. The symptoms are aggravated by certain positions. Nothing relieves the symptoms.    History reviewed. No pertinent past medical history.  There are no active problems to display for this patient.   Past Surgical History:  Procedure Laterality Date  . BREAST SURGERY    . CHOLECYSTECTOMY       OB History    Gravida  3   Para  3   Term  3   Preterm      AB      Living        SAB      TAB      Ectopic      Multiple      Live Births               Home Medications    Prior to Admission medications   Medication Sig Start Date End Date Taking? Authorizing Provider  cephALEXin (KEFLEX) 500  MG capsule Take 1 capsule (500 mg total) by mouth 4 (four) times daily. 09/08/17   Pauline Aus, PA-C    Family History History reviewed. No pertinent family history.  Social History Social History   Tobacco Use  . Smoking status: Current Every Day Smoker    Packs/day: 0.25    Types: Cigarettes  . Smokeless tobacco: Never Used  Substance Use Topics  . Alcohol use: No  . Drug use: No     Allergies   Codeine   Review of Systems Review of Systems  Constitutional: Negative for fever.  HENT: Negative for sore throat.   Eyes: Negative for visual disturbance.  Respiratory: Negative for shortness of breath.   Cardiovascular: Negative for chest pain.  Gastrointestinal: Positive for abdominal pain. Negative for constipation, diarrhea, hematochezia, melena, nausea and vomiting.  Genitourinary: Positive for hematuria. Negative for dysuria and frequency.  Musculoskeletal: Negative for back pain.  Skin: Negative for rash.  Neurological: Negative for headaches.     Physical Exam Updated Vital Signs BP (!) 145/82 (BP Location: Right Arm)   Pulse 93   Temp 98 F (36.7 C) (Oral)   Resp 16   Ht 5\' 4"  (1.626 m)  Wt 63.5 kg   LMP 05/15/2018   SpO2 100%   BMI 24.03 kg/m   Physical Exam  Constitutional: She appears well-developed and well-nourished. No distress.  HENT:  Head: Normocephalic and atraumatic.  Eyes: Conjunctivae are normal.  Neck: Neck supple.  Cardiovascular: Normal rate and regular rhythm.  No murmur heard. Pulmonary/Chest: Effort normal and breath sounds normal. No respiratory distress.  Abdominal: Soft. She exhibits no mass. There is tenderness (llq). There is no rebound.  Genitourinary: Vagina normal. Pelvic exam was performed with patient prone. There is no tenderness on the right labia. There is no tenderness on the left labia. Uterus is not enlarged and not tender. Cervix exhibits discharge (small white). Cervix exhibits no motion tenderness. Right  adnexum displays no mass and no tenderness. Left adnexum displays no mass and no tenderness.  Musculoskeletal: She exhibits no edema or deformity.  Neurological: She is alert.  Skin: Skin is warm and dry.  Psychiatric: She has a normal mood and affect.  Nursing note and vitals reviewed.    ED Treatments / Results  Labs (all labs ordered are listed, but only abnormal results are displayed) Labs Reviewed  URINALYSIS, ROUTINE W REFLEX MICROSCOPIC - Abnormal; Notable for the following components:      Result Value   APPearance HAZY (*)    Hgb urine dipstick MODERATE (*)    Leukocytes, UA TRACE (*)    Bacteria, UA RARE (*)    All other components within normal limits  WET PREP, GENITAL  COMPREHENSIVE METABOLIC PANEL  LIPASE, BLOOD  CBC WITH DIFFERENTIAL/PLATELET  PREGNANCY, URINE  GC/CHLAMYDIA PROBE AMP ([place]) NOT AT [facility]    EKG None  Radiology US Transvaginal Non-ob  Result Date: 05/27/2018 CLINICAL DATA:  Left adnexal pain EXAM: TRANSABDOMINAL AND TRANSVAGINAL ULTRASOUND OF PELVIS DOPPLER ULTRASOUND OF OVARIES TECHNIQUE: Both transabdominal and transvaginal ultrasound examinations of the pelvis were performed. Transabdominal technique was performed for global imaging of the pelvis including uterus, ovaries, adnexal regions, and pelvic cul-de-sac. It was necessary to proceed with endovaginal exam following the transabdominal exam to visualize the uterus endometrium and ovaries. Color and duplex Doppler ultrasound was utilized to evaluate blood flow to the ovaries. COMPARISON:  11/28/2009 FINDINGS: Uterus Measurements: 9.8 cm length by 5.5 cm height by 6.1 cm wide = volume: 328.8 mL. No fibroids or other mass visualized. Endometrium Thickness: 9.5 mm.  No focal abnormality visualized. Right ovary Measurements: 3.2 x 1.8 x 2 cm = volume: 11.5 mL. Normal appearance/no adnexal mass.m Left ovary Measurements: 4.2 x 2.5 x 2.6 cm = volume: 27.3 mL. Normal appearance/no adnexal mass.  Pulsed Doppler evaluation of both ovaries demonstrates normal low-resistance arterial and venous waveforms. Other findings Cystic area in the left adnexal region, separate from the ovary, could represent dilated fallopian tube. Small free fluid in the pelvis IMPRESSION: 1. Negative for ovarian torsion. 2. Tubular cystic area within the left adnexa measuring up to 1 cm in diameter, possibly representing dilated fallopian tube. 3. Small free fluid in the pelvis Electronically Signed   By: Jasmine Pang M.D.   On: 05/27/2018 14:57   US Pelvis Complete  Result Date: 05/27/2018 CLINICAL DATA:  Left adnexal pain EXAM: TRANSABDOMINAL AND TRANSVAGINAL ULTRASOUND OF PELVIS DOPPLER ULTRASOUND OF OVARIES TECHNIQUE: Both transabdominal and transvaginal ultrasound examinations of the pelvis were performed. Transabdominal technique was performed for global imaging of the pelvis including uterus, ovaries, adnexal regions, and pelvic cul-de-sac. It was necessary to proceed with endovaginal exam following the transabdominal exam to  visualize the uterus endometrium and ovaries. Color and duplex Doppler ultrasound was utilized to evaluate blood flow to the ovaries. COMPARISON:  11/28/2009 FINDINGS: Uterus Measurements: 9.8 cm length by 5.5 cm height by 6.1 cm wide = volume: 328.8 mL. No fibroids or other mass visualized. Endometrium Thickness: 9.5 mm.  No focal abnormality visualized. Right ovary Measurements: 3.2 x 1.8 x 2 cm = volume: 11.5 mL. Normal appearance/no adnexal mass.m Left ovary Measurements: 4.2 x 2.5 x 2.6 cm = volume: 27.3 mL. Normal appearance/no adnexal mass. Pulsed Doppler evaluation of both ovaries demonstrates normal low-resistance arterial and venous waveforms. Other findings Cystic area in the left adnexal region, separate from the ovary, could represent dilated fallopian tube. Small free fluid in the pelvis IMPRESSION: 1. Negative for ovarian torsion. 2. Tubular cystic area within the left adnexa measuring  up to 1 cm in diameter, possibly representing dilated fallopian tube. 3. Small free fluid in the pelvis Electronically Signed   By: Jasmine Pang M.D.   On: 05/27/2018 14:57   [place] Art/ven Flow Abd Pelv Doppler  Result Date: 05/27/2018 CLINICAL DATA:  Left adnexal pain EXAM: TRANSABDOMINAL AND TRANSVAGINAL ULTRASOUND OF PELVIS DOPPLER ULTRASOUND OF OVARIES TECHNIQUE: Both transabdominal and transvaginal ultrasound examinations of the pelvis were performed. Transabdominal technique was performed for global imaging of the pelvis including uterus, ovaries, adnexal regions, and pelvic cul-de-sac. It was necessary to proceed with endovaginal exam following the transabdominal exam to visualize the uterus endometrium and ovaries. Color and duplex Doppler ultrasound was utilized to evaluate blood flow to the ovaries. COMPARISON:  11/28/2009 FINDINGS: Uterus Measurements: 9.8 cm length by 5.5 cm height by 6.1 cm wide = volume: 328.8 mL. No fibroids or other mass visualized. Endometrium Thickness: 9.5 mm.  No focal abnormality visualized. Right ovary Measurements: 3.2 x 1.8 x 2 cm = volume: 11.5 mL. Normal appearance/no adnexal mass.m Left ovary Measurements: 4.2 x 2.5 x 2.6 cm = volume: 27.3 mL. Normal appearance/no adnexal mass. Pulsed Doppler evaluation of both ovaries demonstrates normal low-resistance arterial and venous waveforms. Other findings Cystic area in the left adnexal region, separate from the ovary, could represent dilated fallopian tube. Small free fluid in the pelvis IMPRESSION: 1. Negative for ovarian torsion. 2. Tubular cystic area within the left adnexa measuring up to 1 cm in diameter, possibly representing dilated fallopian tube. 3. Small free fluid in the pelvis Electronically Signed   By: Jasmine Pang M.D.   On: 05/27/2018 14:57    Procedures Procedures (including critical care time)  Medications Ordered in ED Medications  sodium chloride 0.9 % bolus 1,000 mL (has no administration in time  range)  ondansetron (ZOFRAN) injection 4 mg (has no administration in time range)  morphine 4 MG/ML injection 4 mg (has no administration in time range)     Initial Impression / Assessment and Plan / ED Course  I have reviewed the triage vital signs and the nursing notes.  Pertinent labs & imaging results that were available during my care of the patient were reviewed by me and considered in my medical decision making (see chart for details).  Clinical Course as of May 27 1549  Fri May 27, 2018  1434 She states her pain is slightly improved.  So far her lab work is been fairly unremarkable.  She is not anemic infectious cell count normal electrolytes LFTs normal.  Her urine has a few white cells in it but also squames.  Would suggest negative.  Awaiting results of ultrasound.  Updated patient  on results of far.   [MB]  1549 Pelvic with chaperone.    [MB]  1549 Being signed out to Dr. Jeraldine Loots with CT abdomen and pelvis pending.  Plan is discharged with pain medicine and nausea medicine if unremarkable to follow-up with her gynecologist.   [MB]    Clinical Course User Index [MB] Terrilee Files, MD      Final Clinical Impressions(s) / ED Diagnoses   Final diagnoses:  None    ED Discharge Orders    None       Terrilee Files, MD 05/27/18 1550

## 2018-05-27 NOTE — ED Triage Notes (Signed)
Patient complaining of lower left abdominal pain and blood in urine x 1 week.

## 2018-05-30 LAB — GC/CHLAMYDIA PROBE AMP (~~LOC~~) NOT AT ARMC
CHLAMYDIA, DNA PROBE: NEGATIVE
Neisseria Gonorrhea: NEGATIVE

## 2018-06-03 ENCOUNTER — Encounter: Payer: Self-pay | Admitting: Obstetrics & Gynecology

## 2018-06-03 ENCOUNTER — Ambulatory Visit (INDEPENDENT_AMBULATORY_CARE_PROVIDER_SITE_OTHER): Payer: Medicaid Other | Admitting: Obstetrics & Gynecology

## 2018-06-03 ENCOUNTER — Encounter: Payer: Self-pay | Admitting: *Deleted

## 2018-06-03 VITALS — BP 113/75 | HR 89 | Ht 64.0 in | Wt 143.4 lb

## 2018-06-03 DIAGNOSIS — R1032 Left lower quadrant pain: Secondary | ICD-10-CM | POA: Diagnosis not present

## 2018-06-03 MED ORDER — NAPROXEN SODIUM 550 MG PO TABS: 550.0000 mg | ORAL_TABLET | Freq: Two times a day (BID) | ORAL | 1 refills | Status: DC

## 2018-06-03 NOTE — Progress Notes (Signed)
Chief Complaint  Patient presents with  . Follow-up-  ER    ovarian cyst- no better/ need note be out of work      37 y.o. G3P3000 Patient's last menstrual period was 05/11/2018. The current method of family planning is tubal ligation.  Outpatient Encounter Medications as of 06/03/2018  Medication Sig  . HYDROcodone-acetaminophen (NORCO/VICODIN) 5-325 MG tablet Take 1-2 tablets by mouth every 6 (six) hours as needed for severe pain.  Marland Kitchen ondansetron (ZOFRAN) 4 MG tablet Take 1 tablet (4 mg total) by mouth every 8 (eight) hours as needed for nausea or vomiting.  . naproxen sodium (ANAPROX DS) 550 MG tablet Take 1 tablet (550 mg total) by mouth 2 (two) times daily with a meal.   No facility-administered encounter medications on file as of 06/03/2018.     Subjective Referred from ED for left ovarian cyst Reviewed CT and sonogram, very unimpressed with the findings Pt states it hurts to move walk twist etc certainly the ovarian findings would not be responsible for that She works at [employer] and does a lot of lifting, she loads truck Her symptoms are most consistent with abdominal wall pain History reviewed. No pertinent past medical history.  Past Surgical History:  Procedure Laterality Date  . BREAST SURGERY    . CHOLECYSTECTOMY      OB History    Gravida  3   Para  3   Term  3   Preterm      AB      Living        SAB      TAB      Ectopic      Multiple      Live Births              Allergies  Allergen Reactions  . Codeine Nausea And Vomiting    Social History   Socioeconomic History  . Marital status: Single    Spouse name: Not on file  . Number of children: Not on file  . Years of education: Not on file  . Highest education level: Not on file  Occupational History  . Not on file  Social Needs  . Financial resource strain: Not on file  . Food insecurity:    Worry: Not on file    Inability: Not on file  . Transportation needs:   Medical: Not on file    Non-medical: Not on file  Tobacco Use  . Smoking status: Current Every Day Smoker    Packs/day: 0.25    Types: Cigarettes  . Smokeless tobacco: Never Used  Substance and Sexual Activity  . Alcohol use: No  . Drug use: No  . Sexual activity: Yes    Birth control/protection: Surgical  Lifestyle  . Physical activity:    Days per week: Not on file    Minutes per session: Not on file  . Stress: Not on file  Relationships  . Social connections:    Talks on phone: Not on file    Gets together: Not on file    Attends religious service: Not on file    Active member of club or organization: Not on file    Attends meetings of clubs or organizations: Not on file    Relationship status: Not on file  Other Topics Concern  . Not on file  Social History Narrative  . Not on file    History reviewed. No pertinent family history.  Medications:  Current Outpatient Medications:  .  HYDROcodone-acetaminophen (NORCO/VICODIN) 5-325 MG tablet, Take 1-2 tablets by mouth every 6 (six) hours as needed for severe pain., Disp: 10 tablet, Rfl: 0 .  ondansetron (ZOFRAN) 4 MG tablet, Take 1 tablet (4 mg total) by mouth every 8 (eight) hours as needed for nausea or vomiting., Disp: 12 tablet, Rfl: 0 .  naproxen sodium (ANAPROX DS) 550 MG tablet, Take 1 tablet (550 mg total) by mouth 2 (two) times daily with a meal., Disp: 60 tablet, Rfl: 1  Objective Blood pressure 113/75, pulse 89, height 5\' 4"  (1.626 m), weight 143 lb 6.4 oz (65 kg), last menstrual period 05/11/2018.   Gen WDWN NAD +Cornet's sign Her pain is isolated at the junction of abdominus and tranversalis in the area of a nerve bundle less likey insertion tear or strain based on exam  Pertinent ROS No burning with urination, frequency or urgency No nausea, vomiting or diarrhea Nor fever chills or other constitutional symptoms   Labs or studies/procedures Reviewed her labs and scans from her ED  visit  Trigger Point Injection   Pre-operative diagnosis: Left rectus abdominus/transverse nerve bundle Myofacial vs neurogenic pain  Post-operative diagnosis: same  After risks and benefits were explained including bleeding, infection, worsening of the pain, damage to the area being injected, weakness, allergic reaction to medications, vascular injection, and nerve damage, signed consent was obtained.  All questions were answered.    The area of the trigger point was identified and the skin prepped three times with alcohol and the alcohol allowed to dry.  Next, a 25 gauge 0.5 inch needle was placed in the area of the trigger point.  Once reproduction of the pain was elicited and negative aspiration confirmed, the trigger point was injected and the needle removed.    The patient did tolerate the procedure well and there were not complications.    Medication used:  20 cc 0.5% marcaine    Trigger points injected: 2    Trigger point(s) location(s):  left     Impression Diagnoses this Encounter::   ICD-10-CM   1. Abdominal wall pain in left lower quadrant R10.32     Established relevant diagnosis(es):   Plan/Recommendations: Meds ordered this encounter  Medications  . naproxen sodium (ANAPROX DS) 550 MG tablet    Sig: Take 1 tablet (550 mg total) by mouth 2 (two) times daily with a meal.    Dispense:  60 tablet    Refill:  1    Labs or Scans Ordered: No orders of the defined types were placed in this encounter.   Management:: >topical lidocaine patch cut to size, 12 on 12 off >Anaprox ds ATC x 2 weeks >FU 2 weeks  Follow up Return in about 2 weeks (around 06/17/2018) for Follow up.    All questions were answered.

## 2018-06-06 ENCOUNTER — Telehealth: Payer: Self-pay | Admitting: *Deleted

## 2018-06-06 NOTE — Telephone Encounter (Signed)
Patient informed per Dr Despina HiddenEure is to stay out of work for 2 weeks or so and see if that helps as it is probably just continually aggravated while working.  Will give note to be out until re-evaluated on 12/2. Pt verbalized understanding and will pick up note tomorrow morning.

## 2018-06-06 NOTE — Telephone Encounter (Signed)
LMOVM returning patient's call.  

## 2018-06-06 NOTE — Telephone Encounter (Signed)
The only thing I know to do is stay out of work for 2 weeks or so and see if that helps, she is probably just continually aggravating it You can give the note if desired

## 2018-06-06 NOTE — Telephone Encounter (Signed)
Patient called stating her pain has not improved since her last visit, feels worse. She has tried using the lidocaine patches prescribed and is unable to take the hydrocodone as it makes her drowsy and dizzy. 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 Please advise.

## 2018-06-20 ENCOUNTER — Ambulatory Visit: Payer: Medicaid Other | Admitting: Obstetrics & Gynecology

## 2018-06-21 ENCOUNTER — Ambulatory Visit: Payer: Medicaid Other | Admitting: Obstetrics & Gynecology

## 2018-06-23 ENCOUNTER — Encounter: Payer: Self-pay | Admitting: Obstetrics & Gynecology

## 2018-06-23 ENCOUNTER — Ambulatory Visit: Payer: Medicaid Other | Admitting: Obstetrics & Gynecology

## 2018-06-23 ENCOUNTER — Other Ambulatory Visit: Payer: Self-pay

## 2018-06-23 VITALS — BP 129/88 | HR 102 | Ht 64.0 in | Wt 145.0 lb

## 2018-06-23 DIAGNOSIS — N921 Excessive and frequent menstruation with irregular cycle: Secondary | ICD-10-CM | POA: Diagnosis not present

## 2018-06-23 DIAGNOSIS — R1032 Left lower quadrant pain: Secondary | ICD-10-CM

## 2018-06-23 DIAGNOSIS — N941 Unspecified dyspareunia: Secondary | ICD-10-CM | POA: Diagnosis not present

## 2018-06-23 MED ORDER — MEGESTROL ACETATE 40 MG PO TABS: ORAL_TABLET | ORAL | 3 refills | Status: DC

## 2018-06-23 NOTE — Progress Notes (Signed)
Chief Complaint  Patient presents with  . Follow-up      37 y.o. G3P3000 Patient's last menstrual period was 06/04/2018. The current method of family planning is tubal ligation.  Outpatient Encounter Medications as of 06/23/2018  Medication Sig  . HYDROcodone-acetaminophen (NORCO/VICODIN) 5-325 MG tablet Take 1-2 tablets by mouth every 6 (six) hours as needed for severe pain.  . naproxen sodium (ANAPROX DS) 550 MG tablet Take 1 tablet (550 mg total) by mouth 2 (two) times daily with a meal.  . ondansetron (ZOFRAN) 4 MG tablet Take 1 tablet (4 mg total) by mouth every 8 (eight) hours as needed for nausea or vomiting.  . megestrol (MEGACE) 40 MG tablet 3 tablets a day for 5 days, 2 tablets a day for 5 days then 1 tablet daily   No facility-administered encounter medications on file as of 06/23/2018.     Subjective Pt states her pain persists better on the patches Has had years of heavy prolonged bleeding bump dyspareunia, chronic, 100% with post coital bleeding Has had BTL, did not respond to management before Sonogram was normal History reviewed. No pertinent past medical history.  Past Surgical History:  Procedure Laterality Date  . BREAST SURGERY    . CHOLECYSTECTOMY      OB History    Gravida  3   Para  3   Term  3   Preterm      AB      Living        SAB      TAB      Ectopic      Multiple      Live Births              Allergies  Allergen Reactions  . Codeine Nausea And Vomiting    Social History   Socioeconomic History  . Marital status: Single    Spouse name: Not on file  . Number of children: Not on file  . Years of education: Not on file  . Highest education level: Not on file  Occupational History  . Not on file  Social Needs  . Financial resource strain: Not on file  . Food insecurity:    Worry: Not on file    Inability: Not on file  . Transportation needs:    Medical: Not on file    Non-medical: Not on file  Tobacco  Use  . Smoking status: Current Every Day Smoker    Packs/day: 0.25    Types: Cigarettes  . Smokeless tobacco: Never Used  Substance and Sexual Activity  . Alcohol use: No  . Drug use: No  . Sexual activity: Yes    Birth control/protection: Surgical  Lifestyle  . Physical activity:    Days per week: Not on file    Minutes per session: Not on file  . Stress: Not on file  Relationships  . Social connections:    Talks on phone: Not on file    Gets together: Not on file    Attends religious service: Not on file    Active member of club or organization: Not on file    Attends meetings of clubs or organizations: Not on file    Relationship status: Not on file  Other Topics Concern  . Not on file  Social History Narrative  . Not on file    History reviewed. No pertinent family history.  Medications:       Current Outpatient Medications:  .  HYDROcodone-acetaminophen (NORCO/VICODIN) 5-325 MG tablet, Take 1-2 tablets by mouth every 6 (six) hours as needed for severe pain., Disp: 10 tablet, Rfl: 0 .  naproxen sodium (ANAPROX DS) 550 MG tablet, Take 1 tablet (550 mg total) by mouth 2 (two) times daily with a meal., Disp: 60 tablet, Rfl: 1 .  ondansetron (ZOFRAN) 4 MG tablet, Take 1 tablet (4 mg total) by mouth every 8 (eight) hours as needed for nausea or vomiting., Disp: 12 tablet, Rfl: 0 .  megestrol (MEGACE) 40 MG tablet, 3 tablets a day for 5 days, 2 tablets a day for 5 days then 1 tablet daily, Disp: 45 tablet, Rfl: 3  Objective Blood pressure 129/88, pulse (!) 102, height 5\' 4"  (1.626 m), weight 145 lb (65.8 kg), last menstrual period 06/04/2018.  Gen WDWN NAD LLQ area still present  Pertinent ROS No burning with urination, frequency or urgency No nausea, vomiting or diarrhea Nor fever chills or other constitutional symptoms   Labs or studies reviewed    Impression Diagnoses this Encounter::   ICD-10-CM   1. Menorrhagia with irregular cycle N92.1   2. Dyspareunia  in female N94.10    bump   3. Abdominal wall pain in left lower quadrant R10.32     Established relevant diagnosis(es):   Plan/Recommendations: Meds ordered this encounter  Medications  . megestrol (MEGACE) 40 MG tablet    Sig: 3 tablets a day for 5 days, 2 tablets a day for 5 days then 1 tablet daily    Dispense:  45 tablet    Refill:  3    Labs or Scans Ordered: No orders of the defined types were placed in this encounter.   Management:: >abdominal wall pain persists in LLQ, some better with lidoderm patches did respond to injection but does not want a repeat injection >start megestrol to see if can control her menses if not recommend TVH with salpingectomy for management  Follow up Return in about 1 month (around 07/24/2018) for Follow up, with Dr Despina Hidden.        All questions were answered.

## 2018-06-27 ENCOUNTER — Telehealth: Payer: Self-pay | Admitting: Obstetrics & Gynecology

## 2018-06-27 NOTE — Telephone Encounter (Signed)
Pt needs a note that it is okay for her to go back to work. Also note needs to state her next appointment on letter please. Pt will come and pick up when ready just call and let her know when ready

## 2018-06-27 NOTE — Telephone Encounter (Signed)
Pt requests note to return to work and stating when her next appt is. Advised that the note would be available for her to pick up at her convenience. Pt verbalized understanding.

## 2018-07-21 ENCOUNTER — Emergency Department (HOSPITAL_COMMUNITY)
Admission: EM | Admit: 2018-07-21 | Discharge: 2018-07-21 | Disposition: A | Discharge status: Home / Self Care | Payer: Medicaid Other | Attending: Emergency Medicine | Admitting: Emergency Medicine

## 2018-07-21 ENCOUNTER — Other Ambulatory Visit: Payer: Self-pay

## 2018-07-21 ENCOUNTER — Encounter (HOSPITAL_COMMUNITY): Payer: Self-pay | Admitting: Emergency Medicine

## 2018-07-21 DIAGNOSIS — F17210 Nicotine dependence, cigarettes, uncomplicated: Secondary | ICD-10-CM | POA: Insufficient documentation

## 2018-07-21 DIAGNOSIS — J111 Influenza due to unidentified influenza virus with other respiratory manifestations: Secondary | ICD-10-CM | POA: Diagnosis not present

## 2018-07-21 DIAGNOSIS — R000 Tachycardia, unspecified: Secondary | ICD-10-CM | POA: Diagnosis not present

## 2018-07-21 DIAGNOSIS — R69 Illness, unspecified: Secondary | ICD-10-CM

## 2018-07-21 DIAGNOSIS — R509 Fever, unspecified: Secondary | ICD-10-CM | POA: Diagnosis not present

## 2018-07-21 DIAGNOSIS — R05 Cough: Secondary | ICD-10-CM | POA: Diagnosis not present

## 2018-07-21 NOTE — ED Provider Notes (Signed)
[facility] EMERGENCY DEPARTMENT Provider Note   CSN: [id number] Arrival date & time: 07/21/18  1000     History   Chief Complaint Chief Complaint  Patient presents with  . Cough    HPI Aimee Webb is a 38 y.o. female who presents today for evaluation of cough, generalized body aches, fatigue and fevers over 101 for 3 days.  She tells me that her 2 children recently tested positive for flu by flu swab.  She did not get a flu shot this year.  She denies any chest pain or shortness of breath.  No nausea vomiting or diarrhea.  She denies any leg swelling, does not have a history of blood clots. HPI  History reviewed. No pertinent past medical history.  There are no active problems to display for this patient.   Past Surgical History:  Procedure Laterality Date  . BREAST SURGERY    . CHOLECYSTECTOMY       OB History    Gravida  3   Para  3   Term  3   Preterm      AB      Living        SAB      TAB      Ectopic      Multiple      Live Births               Home Medications    Prior to Admission medications   Medication Sig Start Date End Date Taking? Authorizing Provider  megestrol (MEGACE) 40 MG tablet 3 tablets a day for 5 days, 2 tablets a day for 5 days then 1 tablet daily 06/23/18  Yes Lazaro Arms, MD  HYDROcodone-acetaminophen (NORCO/VICODIN) 5-325 MG tablet Take 1-2 tablets by mouth every 6 (six) hours as needed for severe pain. Patient not taking: Reported on 07/21/2018 05/27/18   Terrilee Files, MD  naproxen sodium (ANAPROX DS) 550 MG tablet Take 1 tablet (550 mg total) by mouth 2 (two) times daily with a meal. Patient not taking: Reported on 07/21/2018 06/03/18   Lazaro Arms, MD  ondansetron (ZOFRAN) 4 MG tablet Take 1 tablet (4 mg total) by mouth every 8 (eight) hours as needed for nausea or vomiting. Patient not taking: Reported on 07/21/2018 05/27/18   Terrilee Files, MD    Family History History reviewed. No pertinent family  history.  Social History Social History   Tobacco Use  . Smoking status: Current Every Day Smoker    Packs/day: 0.25    Types: Cigarettes  . Smokeless tobacco: Never Used  Substance Use Topics  . Alcohol use: No  . Drug use: No     Allergies   Codeine   Review of Systems Review of Systems  Constitutional: Positive for chills, fatigue and fever.  HENT: Positive for congestion. Negative for ear pain, postnasal drip, sinus pressure, sore throat and trouble swallowing.   Eyes: Negative for visual disturbance.  Respiratory: Positive for cough. Negative for chest tightness and shortness of breath.   Cardiovascular: Negative for chest pain (Occasional, after coughing started), palpitations and leg swelling.  Gastrointestinal: Negative for abdominal pain, diarrhea, nausea and vomiting.  Genitourinary: Negative for dysuria.  Musculoskeletal: Negative for back pain and neck pain.  Skin: Negative for rash.  Neurological: Negative for weakness and headaches.  Psychiatric/Behavioral: Negative for confusion.  All other systems reviewed and are negative.    Physical Exam Updated Vital Signs BP 117/79 (BP Location: Left  Arm)   Pulse (!) 112   Temp 97.8 F (36.6 C) (Oral)   Resp 20   Ht 5\' 5"  (1.651 m)   Wt 59 kg   LMP 06/29/2018   SpO2 99%   BMI 21.63 kg/m   Physical Exam Vitals signs and nursing note reviewed.  Constitutional:      General: She is not in acute distress.    Appearance: She is not ill-appearing.  HENT:     Head: Normocephalic.     Nose: Nose normal. No congestion.     Mouth/Throat:     Mouth: Mucous membranes are moist.     Pharynx: No oropharyngeal exudate or posterior oropharyngeal erythema.  Eyes:     Conjunctiva/sclera: Conjunctivae normal.  Neck:     Musculoskeletal: Normal range of motion and neck supple.  Cardiovascular:     Rate and Rhythm: Tachycardia present.     Pulses: Normal pulses.     Heart sounds: Normal heart sounds.  Pulmonary:       Effort: Pulmonary effort is normal. No respiratory distress.     Breath sounds: Normal breath sounds. No rhonchi.  Abdominal:     General: There is no distension.  Lymphadenopathy:     Cervical: No cervical adenopathy.  Skin:    General: Skin is warm and dry.  Neurological:     Mental Status: She is alert.  Psychiatric:        Mood and Affect: Mood normal.      ED Treatments / Results  Labs (all labs ordered are listed, but only abnormal results are displayed) Labs Reviewed - No data to display  EKG None  Radiology No results found.  Procedures Procedures (including critical care time)  Medications Ordered in ED Medications - No data to display   Initial Impression / Assessment and Plan / ED Course  I have reviewed the triage vital signs and the nursing notes.  Pertinent labs & imaging results that were available during my care of the patient were reviewed by me and considered in my medical decision making (see chart for details).    Patient with symptoms consistent with influenza.  Vitals are stable, low-grade fever.  No signs of dehydration, tolerating PO's.  Lungs are clear. Due to patient's presentation and physical exam a chest x-ray was not ordered bc likely diagnosis of flu, given her two children tested positive and she did not get a flu shot.  Discussed the cost versus benefit of Tamiflu treatment with the patient.  The patient understands that symptoms are greater than the recommended 24-48 hour window of treatment.  Patient will be discharged with instructions to orally hydrate, rest, and use over-the-counter medications such as anti-inflammatories ibuprofen and Aleve for muscle aches and Tylenol for fever.    Final Clinical Impressions(s) / ED Diagnoses   Final diagnoses:  Influenza-like illness    ED Discharge Orders    None       Norman Clay 07/21/18 1137    Doug Sou, MD 07/21/18 1501

## 2018-07-21 NOTE — ED Notes (Signed)
Patient states she is not in any pain, just ready to go home.

## 2018-07-21 NOTE — ED Triage Notes (Signed)
Patient complaining of cough, generalized body aches, and fatigue since Monday. States her two children were diagnosed with flu recently.

## 2018-07-21 NOTE — Discharge Instructions (Signed)
As we discussed today your heart rate is slightly elevated.  This is most likely due to you being sick with influenza-like illness and slightly dehydrated.  Please make sure that you are drinking plenty of fluids.  Today your symptoms are consistent with a flu or a flulike illness.  As I did not test you for the flu we call this a flulike illness.  I have given you information to read on the flu as most of this still applies.  If you have not already, please consider getting a flu shot once you are well.  Please make sure to practice good hand hygiene to help prevent the spread of flu.  We discussed today that many symptoms of the flu such as fever, not feeling well, and body aches can also be the first signs of more serious illnesses or infections.  If you have any new or worsening symptoms please seek additional medical care and evaluation.    Please take Ibuprofen (Advil, motrin) and Tylenol (acetaminophen) to relieve your pain.  You may take up to 600 MG (3 pills) of normal strength ibuprofen every 8 hours as needed.  In between doses of ibuprofen you make take tylenol, up to 1,000 mg (two extra strength pills).  Do not take more than 3,000 mg tylenol in a 24 hour period.  Please check all medication labels as many medications such as pain and cold medications may contain tylenol.  Do not drink alcohol while taking these medications.  Do not take other NSAID'S while taking ibuprofen (such as aleve or naproxen).  Please take ibuprofen with food to decrease stomach upset.

## 2018-07-25 ENCOUNTER — Ambulatory Visit: Payer: Medicaid Other | Admitting: Obstetrics & Gynecology

## 2018-07-25 ENCOUNTER — Encounter: Payer: Self-pay | Admitting: Obstetrics & Gynecology

## 2018-07-25 VITALS — BP 135/92 | HR 126 | Ht 64.0 in | Wt 140.5 lb

## 2018-07-25 DIAGNOSIS — N946 Dysmenorrhea, unspecified: Secondary | ICD-10-CM | POA: Diagnosis not present

## 2018-07-25 DIAGNOSIS — N921 Excessive and frequent menstruation with irregular cycle: Secondary | ICD-10-CM | POA: Diagnosis not present

## 2018-07-25 DIAGNOSIS — N941 Unspecified dyspareunia: Secondary | ICD-10-CM

## 2018-07-25 NOTE — Progress Notes (Signed)
Chief Complaint  Patient presents with  . Follow-up      38 y.o. G3P3000 Patient's last menstrual period was 06/29/2018. The current method of family planning is tubal ligation.  Outpatient Encounter Medications as of 07/25/2018  Medication Sig  . megestrol (MEGACE) 40 MG tablet 3 tablets a day for 5 days, 2 tablets a day for 5 days then 1 tablet daily  . HYDROcodone-acetaminophen (NORCO/VICODIN) 5-325 MG tablet Take 1-2 tablets by mouth every 6 (six) hours as needed for severe pain. (Patient not taking: Reported on 07/21/2018)  . naproxen sodium (ANAPROX DS) 550 MG tablet Take 1 tablet (550 mg total) by mouth 2 (two) times daily with a meal. (Patient not taking: Reported on 07/21/2018)  . ondansetron (ZOFRAN) 4 MG tablet Take 1 tablet (4 mg total) by mouth every 8 (eight) hours as needed for nausea or vomiting. (Patient not taking: Reported on 07/21/2018)   No facility-administered encounter medications on file as of 07/25/2018.     Subjective Pt did not have terrific response with megestrol, started bleeding 12th and bled for 2 weeks despite the megestrol Has not had intercourse in the meantime Due to non response and dyspareunia indicated procedures intra op History reviewed. No pertinent past medical history.  Past Surgical History:  Procedure Laterality Date  . BREAST SURGERY    . CHOLECYSTECTOMY      OB History    Gravida  3   Para  3   Term  3   Preterm      AB      Living        SAB      TAB      Ectopic      Multiple      Live Births              Allergies  Allergen Reactions  . Codeine Nausea And Vomiting    Social History   Socioeconomic History  . Marital status: Single    Spouse name: Not on file  . Number of children: Not on file  . Years of education: Not on file  . Highest education level: Not on file  Occupational History  . Not on file  Social Needs  . Financial resource strain: Not on file  . Food insecurity:    Worry:  Not on file    Inability: Not on file  . Transportation needs:    Medical: Not on file    Non-medical: Not on file  Tobacco Use  . Smoking status: Current Every Day Smoker    Packs/day: 0.25    Types: Cigarettes  . Smokeless tobacco: Never Used  Substance and Sexual Activity  . Alcohol use: No  . Drug use: No  . Sexual activity: Yes    Birth control/protection: Surgical  Lifestyle  . Physical activity:    Days per week: Not on file    Minutes per session: Not on file  . Stress: Not on file  Relationships  . Social connections:    Talks on phone: Not on file    Gets together: Not on file    Attends religious service: Not on file    Active member of club or organization: Not on file    Attends meetings of clubs or organizations: Not on file    Relationship status: Not on file  Other Topics Concern  . Not on file  Social History Narrative  . Not on file    History  reviewed. No pertinent family history.  Medications:       Current Outpatient Medications:  .  megestrol (MEGACE) 40 MG tablet, 3 tablets a day for 5 days, 2 tablets a day for 5 days then 1 tablet daily, Disp: 45 tablet, Rfl: 3 .  HYDROcodone-acetaminophen (NORCO/VICODIN) 5-325 MG tablet, Take 1-2 tablets by mouth every 6 (six) hours as needed for severe pain. (Patient not taking: Reported on 07/21/2018), Disp: 10 tablet, Rfl: 0 .  naproxen sodium (ANAPROX DS) 550 MG tablet, Take 1 tablet (550 mg total) by mouth 2 (two) times daily with a meal. (Patient not taking: Reported on 07/21/2018), Disp: 60 tablet, Rfl: 1 .  ondansetron (ZOFRAN) 4 MG tablet, Take 1 tablet (4 mg total) by mouth every 8 (eight) hours as needed for nausea or vomiting. (Patient not taking: Reported on 07/21/2018), Disp: 12 tablet, Rfl: 0  Objective Blood pressure (!) 135/92, pulse (!) 126, height 5\' 4"  (1.626 m), weight 140 lb 8 oz (63.7 kg), last menstrual period 06/29/2018.  Gen W[facility]N NAD  Pertinent ROS No burning with urination, frequency or  urgency No nausea, vomiting or diarrhea Nor fever chills or other constitutional symptoms   Labs or studies     Impression Diagnoses this Encounter::   ICD-10-CM   1. Menorrhagia with irregular cycle N92.1   2. Dyspareunia in female N94.10   3. Dysmenorrhea N94.6     Established relevant diagnosis(es):   Plan/Recommendations: No orders of the defined types were placed in this encounter.   Labs or Scans Ordered: No orders of the defined types were placed in this encounter.   Management:: >will proceed with [facility] with tubal removal 08/10/18  Follow up Return in about 24 days (around 08/18/2018) for Post Op, with Dr Despina Hidden.        Face to face time:  15 minutes  Greater than 50% of the visit time was spent in counseling and coordination of care with the patient.  The summary and outline of the counseling and care coordination is summarized in the note above.   All questions were answered.

## 2018-07-28 NOTE — Patient Instructions (Signed)
Aimee Webb  07/28/2018     @PREFPERIOPPHARMACY @   Your procedure is scheduled on  08/10/2018   Report to [facility] at  700  A.M.  Call this number if you have problems the morning of surgery:  [phone]   Remember:  Do not eat or drink after midnight.                        Take these medicines the morning of surgery with A SIP OF WATER  None    Do not wear jewelry, make-up or nail polish.  Do not wear lotions, powders, or perfumes, or deodorant.  Do not shave 48 hours prior to surgery.  Men may shave face and neck.  Do not bring valuables to the hospital.  [facility] is not responsible for any belongings or valuables.  Contacts, dentures or bridgework may not be worn into surgery.  Leave your suitcase in the car.  After surgery it may be brought to your room.  For patients admitted to the hospital, discharge time will be determined by your treatment team.  Patients discharged the day of surgery will not be allowed to drive home.   Name and phone number of your driver:   family Special instructions:  None  Please read over the following fact sheets that you were given. Pain Booklet, Blood Transfusion Information, Lab Information, MRSA Information, Surgical Site Infection Prevention, Anesthesia Post-op Instructions and Care and Recovery After Surgery       Vaginal Hysterectomy  A vaginal hysterectomy is a procedure to remove all or part of the uterus through a small incision in the vagina. In this procedure, your health care provider may remove your entire uterus, including the lower end (cervix). You may need a vaginal hysterectomy to treat:  Uterine fibroids.  A condition that causes the lining of the uterus to grow in other areas (endometriosis).  Problems with pelvic support.  Cancer of the cervix, ovaries, uterus, or tissue that lines the uterus (endometrium).  Excessive (dysfunctional) uterine bleeding. When removing your uterus,  your health care provider may also remove the organs that produce eggs (ovaries) and the tubes that carry eggs to your uterus (fallopian tubes). After a vaginal hysterectomy, you will no longer be able to have a baby. You will also no longer get your menstrual period. Tell a health care provider about:  Any allergies you have.  All medicines you are taking, including vitamins, herbs, eye drops, creams, and over-the-counter medicines.  Any problems you or family members have had with anesthetic medicines.  Any blood disorders you have.  Any surgeries you have had.  Any medical conditions you have.  Whether you are pregnant or may be pregnant. What are the risks? Generally, this is a safe procedure. However, problems may occur, including:  Bleeding.  Infection.  A blood clot that forms in your leg and travels to your lungs (pulmonary embolism).  Damage to surrounding organs.  Pain during sex. What happens before the procedure?  Ask your health care provider what organs will be removed during surgery.  Ask your health care provider about: ? Changing or stopping your regular medicines. This is especially important if you are taking diabetes medicines or blood thinners. ? Taking medicines such as aspirin and ibuprofen. These medicines can thin your blood. Do not take these medicines before your procedure if your health care provider instructs  you not to.  Follow instructions from your health care provider about eating or drinking restrictions.  Do not use any tobacco products, such as cigarettes, chewing tobacco, and e-cigarettes. If you need help quitting, ask your health care provider.  Plan to have someone take you home after discharge from the hospital. What happens during the procedure?  To reduce your risk of infection: ? Your health care team will wash or sanitize their hands. ? Your skin will be washed with soap.  An IV tube will be inserted into one of your  veins.  You may be given antibiotic medicine to help prevent infection.  You will be given one or more of the following: ? A medicine to help you relax (sedative). ? A medicine to numb the area (local anesthetic). ? A medicine to make you fall asleep (general anesthetic). ? A medicine that is injected into an area of your body to numb everything beyond the injection site (regional anesthetic).  Your surgeon will make an incision in your vagina.  Your surgeon will locate and remove all or part of your uterus.  Your ovaries and fallopian tubes may be removed at the same time.  The incision will be closed with stitches (sutures) that dissolve over time. The procedure may vary among health care providers and hospitals. What happens after the procedure?  Your blood pressure, heart rate, breathing rate, and blood oxygen level will be monitored often until the medicines you were given have worn off.  You will be encouraged to get up and walk around after a few hours to help prevent complications.  You may have IV tubes in place for a few days.  You will be given pain medicine as needed.  Do not drive for 24 hours if you were given a sedative. This information is not intended to replace advice given to you by your health care provider. Make sure you discuss any questions you have with your health care provider. Document Released: 10/28/2015 Document Revised: 12/12/2015 Document Reviewed: 07/21/2015 Elsevier Interactive Patient Education  2019 [employer]  Vaginal Hysterectomy, Care After Refer to this sheet in the next few weeks. These instructions provide you with information about caring for yourself after your procedure. Your health care provider may also give you more specific instructions. Your treatment has been planned according to current medical practices, but problems sometimes occur. Call your health care provider if you have any problems or questions after your  procedure. What can I expect after the procedure? After the procedure, it is common to have:  Pain.  Soreness and numbness in your incision areas.  Vaginal bleeding and discharge.  Constipation.  Temporary problems emptying the bladder.  Feelings of sadness or other emotions. Follow these instructions at home: Medicines  Take over-the-counter and prescription medicines only as told by your health care provider.  If you were prescribed an antibiotic medicine, take it as told by your health care provider. Do not stop taking the antibiotic even if you start to feel better.  Do not drive or operate heavy machinery while taking prescription pain medicine. Activity  Return to your normal activities as told by your health care provider. Ask your health care provider what activities are safe for you.  Get regular exercise as told by your health care provider. You may be told to take short walks every day and go farther each time.  Do not lift anything that is heavier than 10 lb (4.5 kg). General instructions   Do  not put anything in your vagina for 6 weeks after your surgery or as told by your health care provider. This includes tampons and douches.  Do not have sex until your health care provider says you can.  Do not take baths, swim, or use a hot tub until your health care provider approves.  Drink enough fluid to keep your urine clear or pale yellow.  Do not drive for 24 hours if you were given a sedative.  Keep all follow-up visits as told by your health care provider. This is important. Contact a health care provider if:  Your pain medicine is not helping.  You have a fever.  You have redness, swelling, or pain at your incision site.  You have blood, pus, or a bad-smelling discharge from your vagina.  You continue to have difficulty urinating. Get help right away if:  You have severe abdominal or back pain.  You have heavy bleeding from your vagina.  You  have chest pain or shortness of breath. This information is not intended to replace advice given to you by your health care provider. Make sure you discuss any questions you have with your health care provider. Document Released: 10/28/2015 Document Revised: 12/12/2015 Document Reviewed: 07/21/2015 Elsevier Interactive Patient Education  2019 [employer].  Salpingectomy Salpingectomy, also called tubectomy, is the surgical removal of one of the fallopian tubes. The fallopian tubes are where eggs travel from the ovaries to the uterus. Removing one fallopian tube does not prevent you from becoming pregnant. It also does not cause problems with your menstrual periods. You may need a salpingectomy if you:  Have a fertilized egg that attaches to the fallopian tube (ectopic pregnancy), especially one that causes the tube to burst or tear (rupture).  Have an infected fallopian tube.  Have cancer of the fallopian tube or nearby organs.  Have had an ovary removed due to a cyst or tumor.  Have had your uterus removed. There are three different methods that can be used for a salpingectomy:  Open. This method involves making one large incision in your abdomen.  Laparoscopic. This method involves using a thin, lighted tube with a tiny camera on the end (laparoscope) to help perform the procedure. The laparoscope will allow your surgeon to make several small incisions in the abdomen instead of a large incision.  Robot-assisted: This method involves using a computer to control surgical instruments that are attached to robotic arms. Tell a health care provider about:  Any allergies you have.  All medicines you are taking, including vitamins, herbs, eye drops, creams, and over-the-counter medicines.  Any problems you or family members have had with anesthetic medicines.  Any blood disorders you have.  Any surgeries you have had.  Any medical conditions you have.  Whether you are pregnant or  may be pregnant. What are the risks? Generally, this is a safe procedure. However, problems may occur, including:  Infection.  Bleeding.  Allergic reactions to medicines.  Damage to other structures or organs.  Blood clots in the legs or lungs. What happens before the procedure? Staying hydrated Follow instructions from your health care provider about hydration, which may include:  Up to 2 hours before the procedure - you may continue to drink clear liquids, such as water, clear fruit juice, black coffee, and plain tea. Eating and drinking restrictions Follow instructions from your health care provider about eating and drinking, which may include:  8 hours before the procedure - stop eating heavy meals or  foods such as meat, fried foods, or fatty foods.  6 hours before the procedure - stop eating light meals or foods, such as toast or cereal.  6 hours before the procedure - stop drinking milk or drinks that contain milk.  2 hours before the procedure - stop drinking clear liquids. Medicines  Ask your health care provider about: ? Changing or stopping your regular medicines. This is especially important if you are taking diabetes medicines or blood thinners. ? Taking medicines such as aspirin and ibuprofen. These medicines can thin your blood. Do not take these medicines before your procedure if your health care provider instructs you not to.  You may be given antibiotic medicine to help prevent infection. General instructions  Do not smoke for at least 2 weeks before your procedure. If you need help quitting, ask your health care provider.  You may have an exam or tests, such as an electrocardiogram (ECG).  You may have a blood or urine sample taken.  Ask your health care provider: ? Whether you should stop removing hair from your surgical area. ? How your surgical site will be marked or identified.  You may be asked to shower with a germ-killing soap.  Plan to have  someone take you home from the hospital or clinic.  If you will be going home right after the procedure, plan to have someone with you for 24 hours. What happens during the procedure?  To reduce your risk of infection: ? Your health care team will wash or sanitize their hands. ? Hair may be removed from the surgical area. ? Your skin will be washed with soap.  An IV tube will be inserted into one of your veins.  You will be given a medicine to make you fall asleep (general anesthetic). You may also be given a medicine to help you relax (sedative).  A thin tube (catheter) may be inserted through your urethra and into your bladder to drain urine during your procedure.  Depending on the type of procedure you are having, one incision or several small incisions will be made in your abdomen.  Your fallopian tube will be cut and removed from where it attaches to your uterus.  Your blood vessels will be clamped and tied to prevent excess bleeding.  The incision(s) in your abdomen will be closed with stitches (sutures), staples, or skin glue.  A bandage (dressing) may be placed over your incision(s). The procedure may vary among health care providers and hospitals. What happens after the procedure?   Your blood pressure, heart rate, breathing rate, and blood oxygen level will be monitored until the medicines you were given have worn off.  You may continue to receive fluids and medicines through an IV tube.  You may continue to have a catheter draining your urine.  You may have to wear compression stockings. These stockings help to prevent blood clots and reduce swelling in your legs.  You will be given pain medicine as needed.  Do not drive for 24 hours if you received a sedative. Summary  Salpingectomy is a surgical procedure to remove one of the fallopian tubes.  The procedure may be done with an open incision, with a laparoscope, or with computer-controlled  instruments.  Depending on the type of procedure you are having, one incision or several small incisions will be made in your abdomen.  Your blood pressure, heart rate, breathing rate, and blood oxygen level will be monitored until the medicines you were given have  worn off.  Plan to have someone take you home from the hospital or clinic. This information is not intended to replace advice given to you by your health care provider. Make sure you discuss any questions you have with your health care provider. Document Released: 11/22/2008 Document Revised: 02/21/2016 Document Reviewed: 12/28/2012 Elsevier Interactive Patient Education  2019 Elsevier Inc.  General Anesthesia, Adult General anesthesia is the use of medicines to make a person "go to sleep" (unconscious) for a medical procedure. General anesthesia must be used for certain procedures, and is often recommended for procedures that:  Last a long time.  Require you to be still or in an unusual position.  Are major and can cause blood loss. The medicines used for general anesthesia are called general anesthetics. As well as making you unconscious for a certain amount of time, these medicines:  Prevent pain.  Control your blood pressure.  Relax your muscles. Tell a health care provider about:  Any allergies you have.  All medicines you are taking, including vitamins, herbs, eye drops, creams, and over-the-counter medicines.  Any problems you or family members have had with anesthetic medicines.  Types of anesthetics you have had in the past.  Any blood disorders you have.  Any surgeries you have had.  Any medical conditions you have.  Any recent upper respiratory, chest, or ear infections.  Any history of: ? Heart or lung conditions, such as heart failure, sleep apnea, asthma, or chronic obstructive pulmonary disease (COPD). ? Financial planner. ? Depression or anxiety.  Any tobacco or drug use, including marijuana  or alcohol use.  Whether you are pregnant or may be pregnant. What are the risks? Generally, this is a safe procedure. However, problems may occur, including:  Allergic reaction.  Lung and heart problems.  Inhaling food or liquid from the stomach into the lungs (aspiration).  Nerve injury.  Dental injury.  Air in the bloodstream, which can lead to stroke.  Extreme agitation or confusion (delirium) when you wake up from the anesthetic.  Waking up during your procedure and being unable to move. This is rare. These problems are more likely to develop if you are having a major surgery or if you have an advanced or serious medical condition. You can prevent some of these complications by answering all of your health care provider's questions thoroughly and by following all instructions before your procedure. General anesthesia can cause side effects, including:  Nausea or vomiting.  A sore throat from the breathing tube.  Hoarseness.  Wheezing or coughing.  Shaking chills.  Tiredness.  Body aches.  Anxiety.  Sleepiness or drowsiness.  Confusion or agitation. What happens before the procedure? Staying hydrated Follow instructions from your health care provider about hydration, which may include:  Up to 2 hours before the procedure - you may continue to drink clear liquids, such as water, clear fruit juice, black coffee, and plain tea.  Eating and drinking restrictions Follow instructions from your health care provider about eating and drinking, which may include:  8 hours before the procedure - stop eating heavy meals or foods such as meat, fried foods, or fatty foods.  6 hours before the procedure - stop eating light meals or foods, such as toast or cereal.  6 hours before the procedure - stop drinking milk or drinks that contain milk.  2 hours before the procedure - stop drinking clear liquids. Medicines Ask your health care provider about:  Changing or  stopping your regular medicines. This  is especially important if you are taking diabetes medicines or blood thinners.  Taking medicines such as aspirin and ibuprofen. These medicines can thin your blood. Do not take these medicines unless your health care provider tells you to take them.  Taking over-the-counter medicines, vitamins, herbs, and supplements. Do not take these during the week before your procedure unless your health care provider approves them. General instructions  Starting 3-6 weeks before the procedure, do not use any products that contain nicotine or tobacco, such as cigarettes and e-cigarettes. If you need help quitting, ask your health care provider.  If you brush your teeth on the morning of the procedure, make sure to spit out all of the toothpaste.  Tell your health care provider if you become ill or develop a cold, cough, or fever.  If instructed by your health care provider, bring your sleep apnea device with you on the day of your surgery (if applicable).  Ask your health care provider if you will be going home the same day, the following day, or after a longer hospital stay. ? Plan to have someone take you home from the hospital or clinic. ? Plan to have a responsible adult care for you for at least 24 hours after you leave the hospital or clinic. This is important. What happens during the procedure?   You will be given anesthetics through both of the following: ? A mask placed over your nose and mouth. ? An IV in one of your veins.  You may receive a medicine to help you relax (sedative).  After you are unconscious, a breathing tube may be inserted down your throat to help you breathe. This will be removed before you wake up.  An anesthesia specialist will stay with you throughout your procedure. He or she will: ? Keep you comfortable and safe by continuing to give you medicines and adjusting the amount of medicine that you get. ? Monitor your blood  pressure, pulse, and oxygen levels to make sure that the anesthetics do not cause any problems. The procedure may vary among health care providers and hospitals. What happens after the procedure?  Your blood pressure, temperature, heart rate, breathing rate, and blood oxygen level will be monitored until the medicines you were given have worn off.  You will wake up in a recovery area. You may wake up slowly.  If you feel anxious or agitated, you may be given medicine to help you calm down.  If you will be going home the same day, your health care provider may check to make sure you can walk, drink, and urinate.  Your health care provider will treat any pain or side effects you have before you go home.  Do not drive for 24 hours if you were given a sedative. Summary  General anesthesia is used to keep you still and prevent pain during a procedure.  It is important to tell your health care provider about your medical history and any surgeries you have had, and previous experience with anesthesia.  Follow your health care provider's instructions about when to stop eating, drinking, or taking certain medicines before your procedure.  Plan to have someone take you home from the hospital or clinic. This information is not intended to replace advice given to you by your health care provider. Make sure you discuss any questions you have with your health care provider. Document Released: 10/13/2007 Document Revised: 11/23/2017 Document Reviewed: 02/19/2017 Elsevier Interactive Patient Education  2019 [employer]. General Anesthesia,  Adult, Care After This sheet gives you information about how to care for yourself after your procedure. Your health care provider may also give you more specific instructions. If you have problems or questions, contact your health care provider. What can I expect after the procedure? After the procedure, the following side effects are common:  Pain or discomfort  at the IV site.  Nausea.  Vomiting.  Sore throat.  Trouble concentrating.  Feeling cold or chills.  Weak or tired.  Sleepiness and fatigue.  Soreness and body aches. These side effects can affect parts of the body that were not involved in surgery. Follow these instructions at home:  For at least 24 hours after the procedure:  Have a responsible adult stay with you. It is important to have someone help care for you until you are awake and alert.  Rest as needed.  Do not: ? Participate in activities in which you could fall or become injured. ? Drive. ? Use heavy machinery. ? Drink alcohol. ? Take sleeping pills or medicines that cause drowsiness. ? Make important decisions or sign legal documents. ? Take care of children on your own. Eating and drinking  Follow any instructions from your health care provider about eating or drinking restrictions.  When you feel hungry, start by eating small amounts of foods that are soft and easy to digest (bland), such as toast. Gradually return to your regular diet.  Drink enough fluid to keep your urine pale yellow.  If you vomit, rehydrate by drinking water, juice, or clear broth. General instructions  If you have sleep apnea, surgery and certain medicines can increase your risk for breathing problems. Follow instructions from your health care provider about wearing your sleep device: ? Anytime you are sleeping, including during daytime naps. ? While taking prescription pain medicines, sleeping medicines, or medicines that make you drowsy.  Return to your normal activities as told by your health care provider. Ask your health care provider what activities are safe for you.  Take over-the-counter and prescription medicines only as told by your health care provider.  If you smoke, do not smoke without supervision.  Keep all follow-up visits as told by your health care provider. This is important. Contact a health care provider  if:  You have nausea or vomiting that does not get better with medicine.  You cannot eat or drink without vomiting.  You have pain that does not get better with medicine.  You are unable to pass urine.  You develop a skin rash.  You have a fever.  You have redness around your IV site that gets worse. Get help right away if:  You have difficulty breathing.  You have chest pain.  You have blood in your urine or stool, or you vomit blood. Summary  After the procedure, it is common to have a sore throat or nausea. It is also common to feel tired.  Have a responsible adult stay with you for the first 24 hours after general anesthesia. It is important to have someone help care for you until you are awake and alert.  When you feel hungry, start by eating small amounts of foods that are soft and easy to digest (bland), such as toast. Gradually return to your regular diet.  Drink enough fluid to keep your urine pale yellow.  Return to your normal activities as told by your health care provider. Ask your health care provider what activities are safe for you. This information is not intended  to replace advice given to you by your health care provider. Make sure you discuss any questions you have with your health care provider. Document Released: 10/12/2000 Document Revised: 02/19/2017 Document Reviewed: 02/19/2017 Elsevier Interactive Patient Education  2019 [employer].

## 2018-08-05 ENCOUNTER — Encounter (HOSPITAL_COMMUNITY)
Admission: RE | Admit: 2018-08-05 | Discharge: 2018-08-05 | Disposition: A | Discharge status: Home / Self Care | Payer: Medicaid Other | Source: Ambulatory Visit | Attending: Obstetrics & Gynecology | Admitting: Obstetrics & Gynecology

## 2018-08-05 ENCOUNTER — Other Ambulatory Visit: Payer: Self-pay | Admitting: Obstetrics & Gynecology

## 2018-08-05 ENCOUNTER — Encounter (HOSPITAL_COMMUNITY): Payer: Self-pay

## 2018-08-05 DIAGNOSIS — Z01812 Encounter for preprocedural laboratory examination: Secondary | ICD-10-CM | POA: Insufficient documentation

## 2018-08-05 HISTORY — DX: Other specified health status: Z78.9

## 2018-08-05 LAB — URINALYSIS, ROUTINE W REFLEX MICROSCOPIC
BILIRUBIN URINE: NEGATIVE
Glucose, UA: NEGATIVE mg/dL
Ketones, ur: NEGATIVE mg/dL
Nitrite: NEGATIVE
Protein, ur: NEGATIVE mg/dL
SPECIFIC GRAVITY, URINE: 1.019 (ref 1.005–1.030)
pH: 5 (ref 5.0–8.0)

## 2018-08-05 LAB — TYPE AND SCREEN
ABO/RH(D): B POS
Antibody Screen: NEGATIVE

## 2018-08-05 LAB — COMPREHENSIVE METABOLIC PANEL
ALBUMIN: 4.1 g/dL (ref 3.5–5.0)
ALK PHOS: 48 U/L (ref 38–126)
ALT: 20 U/L (ref 0–44)
AST: 16 U/L (ref 15–41)
Anion gap: 9 (ref 5–15)
BILIRUBIN TOTAL: 0.5 mg/dL (ref 0.3–1.2)
BUN: 11 mg/dL (ref 6–20)
CO2: 20 mmol/L — ABNORMAL LOW (ref 22–32)
CREATININE: 0.7 mg/dL (ref 0.44–1.00)
Calcium: 9.5 mg/dL (ref 8.9–10.3)
Chloride: 109 mmol/L (ref 98–111)
GFR calc Af Amer: >60 mL/min (ref >60)
GLUCOSE: 82 mg/dL (ref 70–99)
Potassium: 4 mmol/L (ref 3.5–5.1)
Sodium: 138 mmol/L (ref 135–145)
TOTAL PROTEIN: 7.4 g/dL (ref 6.5–8.1)

## 2018-08-05 LAB — CBC
HEMATOCRIT: 41.1 % (ref 36.0–46.0)
HEMOGLOBIN: 12.8 g/dL (ref 12.0–15.0)
MCH: 27.8 pg (ref 26.0–34.0)
MCHC: 31.1 g/dL (ref 30.0–36.0)
MCV: 89.2 fL (ref 80.0–100.0)
NRBC: 0 % (ref 0.0–0.2)
Platelets: 492 10*3/uL — ABNORMAL HIGH (ref 150–400)
RBC: 4.61 MIL/uL (ref 3.87–5.11)
RDW: 14.9 % (ref 11.5–15.5)
WBC: 10.1 10*3/uL (ref 4.0–10.5)

## 2018-08-05 LAB — RAPID HIV SCREEN (HIV 1/2 AB+AG)
HIV 1/2 Antibodies: NONREACTIVE
HIV-1 P24 Antigen - HIV24: NONREACTIVE

## 2018-08-05 LAB — HCG, QUANTITATIVE, PREGNANCY

## 2018-08-10 ENCOUNTER — Encounter (HOSPITAL_COMMUNITY)
Admission: RE | Disposition: A | Discharge status: Home / Self Care | Payer: Self-pay | Source: Home / Self Care | Attending: Obstetrics & Gynecology

## 2018-08-10 ENCOUNTER — Observation Stay (HOSPITAL_COMMUNITY)
Admission: RE | Admit: 2018-08-10 | Discharge: 2018-08-10 | Disposition: A | Discharge status: Home / Self Care | Payer: Medicaid Other | Attending: Obstetrics & Gynecology | Admitting: Obstetrics & Gynecology

## 2018-08-10 ENCOUNTER — Observation Stay (HOSPITAL_COMMUNITY): Payer: Medicaid Other | Admitting: Anesthesiology

## 2018-08-10 DIAGNOSIS — Z885 Allergy status to narcotic agent status: Secondary | ICD-10-CM | POA: Diagnosis not present

## 2018-08-10 DIAGNOSIS — N938 Other specified abnormal uterine and vaginal bleeding: Secondary | ICD-10-CM | POA: Diagnosis not present

## 2018-08-10 DIAGNOSIS — N72 Inflammatory disease of cervix uteri: Secondary | ICD-10-CM | POA: Diagnosis not present

## 2018-08-10 DIAGNOSIS — N946 Dysmenorrhea, unspecified: Secondary | ICD-10-CM | POA: Insufficient documentation

## 2018-08-10 DIAGNOSIS — Z79899 Other long term (current) drug therapy: Secondary | ICD-10-CM | POA: Insufficient documentation

## 2018-08-10 DIAGNOSIS — Z791 Long term (current) use of non-steroidal anti-inflammatories (NSAID): Secondary | ICD-10-CM | POA: Insufficient documentation

## 2018-08-10 DIAGNOSIS — F17210 Nicotine dependence, cigarettes, uncomplicated: Secondary | ICD-10-CM | POA: Insufficient documentation

## 2018-08-10 DIAGNOSIS — N941 Unspecified dyspareunia: Secondary | ICD-10-CM | POA: Insufficient documentation

## 2018-08-10 DIAGNOSIS — Z90710 Acquired absence of both cervix and uterus: Secondary | ICD-10-CM | POA: Diagnosis present

## 2018-08-10 DIAGNOSIS — N921 Excessive and frequent menstruation with irregular cycle: Secondary | ICD-10-CM | POA: Diagnosis not present

## 2018-08-10 HISTORY — PX: VAGINAL HYSTERECTOMY: SHX2639

## 2018-08-10 LAB — BASIC METABOLIC PANEL
Anion gap: 8 (ref 5–15)
BUN: 9 mg/dL (ref 6–20)
CO2: 22 mmol/L (ref 22–32)
Calcium: 9 mg/dL (ref 8.9–10.3)
Chloride: 106 mmol/L (ref 98–111)
Creatinine, Ser: 0.75 mg/dL (ref 0.44–1.00)
GFR calc Af Amer: >60 mL/min (ref >60)
GFR calc non Af Amer: >60 mL/min (ref >60)
Glucose, Bld: 143 mg/dL — ABNORMAL HIGH (ref 70–99)
Potassium: 4 mmol/L (ref 3.5–5.1)
Sodium: 136 mmol/L (ref 135–145)

## 2018-08-10 LAB — CBC
HCT: 37.5 % (ref 36.0–46.0)
Hemoglobin: 11.8 g/dL — ABNORMAL LOW (ref 12.0–15.0)
MCH: 28.4 pg (ref 26.0–34.0)
MCHC: 31.5 g/dL (ref 30.0–36.0)
MCV: 90.1 fL (ref 80.0–100.0)
Platelets: 452 10*3/uL — ABNORMAL HIGH (ref 150–400)
RBC: 4.16 MIL/uL (ref 3.87–5.11)
RDW: 15 % (ref 11.5–15.5)
WBC: 18.2 10*3/uL — ABNORMAL HIGH (ref 4.0–10.5)
nRBC: 0 % (ref 0.0–0.2)

## 2018-08-10 SURGERY — HYSTERECTOMY, VAGINAL
Anesthesia: General

## 2018-08-10 MED ORDER — PROMETHAZINE HCL 25 MG/ML IJ SOLN: 6.2500 mg | INTRAMUSCULAR | Status: DC | PRN

## 2018-08-10 MED ORDER — SCOPOLAMINE 1 MG/3DAYS TD PT72
1.0000 | MEDICATED_PATCH | TRANSDERMAL | Status: DC
  Administered 2018-08-10: 1.5 mg via TRANSDERMAL

## 2018-08-10 MED ORDER — SUGAMMADEX SODIUM 200 MG/2ML IV SOLN
INTRAVENOUS | Status: DC | PRN
  Administered 2018-08-10: 200 mg via INTRAVENOUS

## 2018-08-10 MED ORDER — PROPOFOL 10 MG/ML IV BOLUS
INTRAVENOUS | Status: AC
  Filled 2018-08-10: qty 40

## 2018-08-10 MED ORDER — ONDANSETRON HCL 4 MG/2ML IJ SOLN
INTRAMUSCULAR | Status: AC
  Filled 2018-08-10: qty 2

## 2018-08-10 MED ORDER — DIPHENHYDRAMINE HCL 50 MG/ML IJ SOLN: 25.0000 mg | Freq: Four times a day (QID) | INTRAMUSCULAR | Status: DC | PRN

## 2018-08-10 MED ORDER — DEXAMETHASONE SODIUM PHOSPHATE 10 MG/ML IJ SOLN
INTRAMUSCULAR | Status: DC | PRN
  Administered 2018-08-10: 5 mg via INTRAVENOUS

## 2018-08-10 MED ORDER — KETOROLAC TROMETHAMINE 10 MG PO TABS: 10.0000 mg | ORAL_TABLET | Freq: Three times a day (TID) | ORAL | 0 refills | Status: DC | PRN

## 2018-08-10 MED ORDER — BUPIVACAINE-EPINEPHRINE (PF) 0.25% -1:200000 IJ SOLN
INTRAMUSCULAR | Status: AC
  Filled 2018-08-10: qty 30

## 2018-08-10 MED ORDER — FENTANYL CITRATE (PF) 100 MCG/2ML IJ SOLN: 50.0000 ug | INTRAMUSCULAR | Status: DC | PRN

## 2018-08-10 MED ORDER — HYDROMORPHONE HCL 1 MG/ML IJ SOLN
0.2500 mg | INTRAMUSCULAR | Status: DC | PRN
  Administered 2018-08-10: 0.5 mg via INTRAVENOUS

## 2018-08-10 MED ORDER — KETOROLAC TROMETHAMINE 30 MG/ML IJ SOLN
30.0000 mg | Freq: Once | INTRAMUSCULAR | Status: AC
  Administered 2018-08-10: 30 mg via INTRAVENOUS
  Filled 2018-08-10: qty 1

## 2018-08-10 MED ORDER — FENTANYL CITRATE (PF) 100 MCG/2ML IJ SOLN
INTRAMUSCULAR | Status: AC
  Filled 2018-08-10: qty 4

## 2018-08-10 MED ORDER — LIDOCAINE HCL 1 % IJ SOLN
INTRAMUSCULAR | Status: DC | PRN
  Administered 2018-08-10: 50 mg via INTRADERMAL

## 2018-08-10 MED ORDER — SCOPOLAMINE 1 MG/3DAYS TD PT72
MEDICATED_PATCH | TRANSDERMAL | Status: AC
  Filled 2018-08-10: qty 1

## 2018-08-10 MED ORDER — OXYCODONE-ACETAMINOPHEN 5-325 MG PO TABS: 1.0000 | ORAL_TABLET | ORAL | Status: DC | PRN

## 2018-08-10 MED ORDER — PROPOFOL 10 MG/ML IV BOLUS
INTRAVENOUS | Status: DC | PRN
  Administered 2018-08-10: 150 mg via INTRAVENOUS

## 2018-08-10 MED ORDER — ONDANSETRON HCL 8 MG PO TABS: 8.0000 mg | ORAL_TABLET | Freq: Four times a day (QID) | ORAL | 0 refills | Status: DC | PRN

## 2018-08-10 MED ORDER — OXYCODONE-ACETAMINOPHEN 5-325 MG PO TABS: 1.0000 | ORAL_TABLET | ORAL | 0 refills | Status: DC | PRN

## 2018-08-10 MED ORDER — ENOXAPARIN SODIUM 40 MG/0.4ML ~~LOC~~ SOLN: 40.0000 mg | SUBCUTANEOUS | Status: DC

## 2018-08-10 MED ORDER — STERILE WATER FOR IRRIGATION IR SOLN
Status: DC | PRN
  Administered 2018-08-10: 1000 mL

## 2018-08-10 MED ORDER — SODIUM CHLORIDE 0.9 % IR SOLN
Status: DC | PRN
  Administered 2018-08-10: 3000 mL

## 2018-08-10 MED ORDER — MIDAZOLAM HCL 2 MG/2ML IJ SOLN
INTRAMUSCULAR | Status: AC
  Filled 2018-08-10: qty 2

## 2018-08-10 MED ORDER — CEFAZOLIN SODIUM-DEXTROSE 2-4 GM/100ML-% IV SOLN
2.0000 g | INTRAVENOUS | Status: AC
  Administered 2018-08-10: 2 g via INTRAVENOUS
  Filled 2018-08-10: qty 100

## 2018-08-10 MED ORDER — HYDROMORPHONE HCL 1 MG/ML IJ SOLN
INTRAMUSCULAR | Status: AC
  Filled 2018-08-10: qty 0.5

## 2018-08-10 MED ORDER — SODIUM CHLORIDE 0.9 % IV SOLN
8.0000 mg | Freq: Four times a day (QID) | INTRAVENOUS | Status: DC | PRN
  Filled 2018-08-10: qty 4

## 2018-08-10 MED ORDER — ROCURONIUM BROMIDE 100 MG/10ML IV SOLN
INTRAVENOUS | Status: DC | PRN
  Administered 2018-08-10: 10 mg via INTRAVENOUS
  Administered 2018-08-10: 40 mg via INTRAVENOUS

## 2018-08-10 MED ORDER — BUPIVACAINE-EPINEPHRINE (PF) 0.25% -1:200000 IJ SOLN
INTRAMUSCULAR | Status: DC | PRN
  Administered 2018-08-10: 30 mL

## 2018-08-10 MED ORDER — ROCURONIUM BROMIDE 10 MG/ML (PF) SYRINGE
PREFILLED_SYRINGE | INTRAVENOUS | Status: AC
  Filled 2018-08-10: qty 10

## 2018-08-10 MED ORDER — MIDAZOLAM HCL 2 MG/2ML IJ SOLN: 0.5000 mg | Freq: Once | INTRAMUSCULAR | Status: DC | PRN

## 2018-08-10 MED ORDER — FENTANYL CITRATE (PF) 100 MCG/2ML IJ SOLN
INTRAMUSCULAR | Status: DC | PRN
  Administered 2018-08-10 (×2): 50 ug via INTRAVENOUS

## 2018-08-10 MED ORDER — ALUM & MAG HYDROXIDE-SIMETH 200-200-20 MG/5ML PO SUSP: 30.0000 mL | ORAL | Status: DC | PRN

## 2018-08-10 MED ORDER — SUGAMMADEX SODIUM 200 MG/2ML IV SOLN
INTRAVENOUS | Status: AC
  Filled 2018-08-10: qty 2

## 2018-08-10 MED ORDER — DOCUSATE SODIUM 100 MG PO CAPS: 100.0000 mg | ORAL_CAPSULE | Freq: Two times a day (BID) | ORAL | Status: DC

## 2018-08-10 MED ORDER — ONDANSETRON HCL 4 MG PO TABS: 8.0000 mg | ORAL_TABLET | Freq: Four times a day (QID) | ORAL | Status: DC | PRN

## 2018-08-10 MED ORDER — LACTATED RINGERS IV SOLN
INTRAVENOUS | Status: DC
  Administered 2018-08-10: 1000 mL via INTRAVENOUS
  Administered 2018-08-10: 10:00:00 via INTRAVENOUS

## 2018-08-10 MED ORDER — 0.9 % SODIUM CHLORIDE (POUR BTL) OPTIME
TOPICAL | Status: DC | PRN
  Administered 2018-08-10: 1000 mL

## 2018-08-10 MED ORDER — KCL IN DEXTROSE-NACL 20-5-0.45 MEQ/L-%-% IV SOLN
INTRAVENOUS | Status: AC
  Administered 2018-08-10: 13:00:00 via INTRAVENOUS

## 2018-08-10 MED ORDER — MIDAZOLAM HCL 5 MG/5ML IJ SOLN
INTRAMUSCULAR | Status: DC | PRN
  Administered 2018-08-10: 2 mg via INTRAVENOUS

## 2018-08-10 SURGICAL SUPPLY — 32 items
BLADE SURG SZ10 CARB STEEL (BLADE) ×3 IMPLANT
CLOTH BEACON ORANGE TIMEOUT ST (SAFETY) ×3 IMPLANT
COVER LIGHT HANDLE STERIS (MISCELLANEOUS) ×6 IMPLANT
COVER WAND RF STERILE (DRAPES) ×3 IMPLANT
DECANTER SPIKE VIAL GLASS SM (MISCELLANEOUS) ×3 IMPLANT
DRAPE HALF SHEET 40X57 (DRAPES) ×3 IMPLANT
DRAPE STERI URO 9X17 APER PCH (DRAPES) ×3 IMPLANT
ELECT REM PT RETURN 9FT ADLT (ELECTROSURGICAL) ×3
ELECTRODE REM PT RTRN 9FT ADLT (ELECTROSURGICAL) ×1 IMPLANT
GAUZE 4X4 16PLY RFD (DISPOSABLE) ×3 IMPLANT
GLOVE BIOGEL PI IND STRL 7.0 (GLOVE) ×4 IMPLANT
GLOVE BIOGEL PI IND STRL 8 (GLOVE) ×2 IMPLANT
GLOVE BIOGEL PI INDICATOR 7.0 (GLOVE) ×8
GLOVE BIOGEL PI INDICATOR 8 (GLOVE) ×4
GLOVE ECLIPSE 8.0 STRL XLNG CF (GLOVE) ×3 IMPLANT
GOWN STRL REUS W/TWL LRG LVL3 (GOWN DISPOSABLE) ×9 IMPLANT
GOWN STRL REUS W/TWL XL LVL3 (GOWN DISPOSABLE) ×3 IMPLANT
IV NS IRRIG 3000ML ARTHROMATIC (IV SOLUTION) ×3 IMPLANT
KIT BLADEGUARD II DBL (SET/KITS/TRAYS/PACK) ×3 IMPLANT
KIT TURNOVER CYSTO (KITS) ×3 IMPLANT
MANIFOLD NEPTUNE II (INSTRUMENTS) ×3 IMPLANT
NEEDLE HYPO 22GX1.5 SAFETY (NEEDLE) ×3 IMPLANT
NS IRRIG 1000ML POUR BTL (IV SOLUTION) ×3 IMPLANT
PACK PERI GYN (CUSTOM PROCEDURE TRAY) ×3 IMPLANT
PAD ARMBOARD 7.5X6 YLW CONV (MISCELLANEOUS) ×3 IMPLANT
SET BASIN LINEN APH (SET/KITS/TRAYS/PACK) ×3 IMPLANT
SUT VIC AB 0 CT1 27 (SUTURE) ×6
SUT VIC AB 0 CT1 27XCR 8 STRN (SUTURE) ×3 IMPLANT
SYR CONTROL 10ML LL (SYRINGE) ×3 IMPLANT
TRAY FOLEY MTR SLVR 16FR STAT (SET/KITS/TRAYS/PACK) ×3 IMPLANT
VERSALIGHT (MISCELLANEOUS) ×3 IMPLANT
WATER STERILE IRR 1000ML POUR (IV SOLUTION) ×3 IMPLANT

## 2018-08-10 NOTE — Anesthesia Procedure Notes (Signed)
Procedure Name: Intubation Date/Time: 08/10/2018 8:49 AM Performed by: Charmaine Downs, CRNA Pre-anesthesia Checklist: Patient identified, Patient being monitored, Timeout performed, Emergency Drugs available and Suction available Patient Re-evaluated:Patient Re-evaluated prior to induction Oxygen Delivery Method: Circle System Utilized Preoxygenation: Pre-oxygenation with 100% oxygen Induction Type: IV induction Ventilation: Mask ventilation without difficulty Laryngoscope Size: Mac and 3 Grade View: Grade I Tube type: Oral Tube size: 7.0 mm Number of attempts: 1 Airway Equipment and Method: stylet Placement Confirmation: ETT inserted through vocal cords under direct vision,  positive ETCO2 and breath sounds checked- equal and bilateral Secured at: 22 cm Tube secured with: Tape Dental Injury: Teeth and Oropharynx as per pre-operative assessment

## 2018-08-10 NOTE — H&P (Signed)
Preoperative History and Physical  Aimee Webb is a 38 y.o. G3P3000 with No LMP recorded. admitted for a TVH.   Pt did not have terrific response with megestrol, started bleeding 12th and bled for 2 weeks despite the megestrol Has not had intercourse in the meantime Due to non response and dyspareunia indicated procedures intra op History reviewed. No pertinent past medical history.   Pt states her pain persists better on the patches Has had years of heavy prolonged bleeding bump dyspareunia, chronic, 100% with post coital bleeding Has had BTL, did not respond to management before Sonogram was normal History reviewed. No pertinent past medical history.   PMH:    Past Medical History:  Diagnosis Date  . Medical history non-contributory     PSH:     Past Surgical History:  Procedure Laterality Date  . BREAST SURGERY    . CHOLECYSTECTOMY      POb/GynH:      OB History    Gravida  3   Para  3   Term  3   Preterm      AB      Living        SAB      TAB      Ectopic      Multiple      Live Births              SH:   Social History   Tobacco Use  . Smoking status: Current Every Day Smoker    Packs/day: 0.25    Types: Cigarettes  . Smokeless tobacco: Never Used  Substance Use Topics  . Alcohol use: No  . Drug use: No    FH:   No family history on file.   Allergies:  Allergies  Allergen Reactions  . Codeine Nausea And Vomiting    Medications:       Current Facility-Administered Medications:  .  ceFAZolin (ANCEF) IVPB 2g/100 mL premix, 2 g, Intravenous, On Call to OR, Lazaro ArmsEure, Luther H, MD  Review of Systems:   Review of Systems  Constitutional: Negative for fever, chills, weight loss, malaise/fatigue and diaphoresis.  HENT: Negative for hearing loss, ear pain, nosebleeds, congestion, sore throat, neck pain, tinnitus and ear discharge.   Eyes: Negative for blurred vision, double vision, photophobia, pain, discharge and redness.   Respiratory: Negative for cough, hemoptysis, sputum production, shortness of breath, wheezing and stridor.   Cardiovascular: Negative for chest pain, palpitations, orthopnea, claudication, leg swelling and PND.  Gastrointestinal: Positive for abdominal pain. Negative for heartburn, nausea, vomiting, diarrhea, constipation, blood in stool and melena.  Genitourinary: Negative for dysuria, urgency, frequency, hematuria and flank pain.  Musculoskeletal: Negative for myalgias, back pain, joint pain and falls.  Skin: Negative for itching and rash.  Neurological: Negative for dizziness, tingling, tremors, sensory change, speech change, focal weakness, seizures, loss of consciousness, weakness and headaches.  Endo/Heme/Allergies: Negative for environmental allergies and polydipsia. Does not bruise/bleed easily.  Psychiatric/Behavioral: Negative for depression, suicidal ideas, hallucinations, memory loss and substance abuse. The patient is not nervous/anxious and does not have insomnia.      PHYSICAL EXAM:  Blood pressure (!) 114/93, temperature 99 F (37.2 C), resp. rate 19, SpO2 98 %.    Vitals reviewed. Constitutional: She is oriented to person, place, and time. She appears well-developed and well-nourished.  HENT:  Head: Normocephalic and atraumatic.  Right Ear: External ear normal.  Left Ear: External ear normal.  Nose: Nose normal.  Mouth/Throat: Oropharynx is  clear and moist.  Eyes: Conjunctivae and EOM are normal. Pupils are equal, round, and reactive to light. Right eye exhibits no discharge. Left eye exhibits no discharge. No scleral icterus.  Neck: Normal range of motion. Neck supple. No tracheal deviation present. No thyromegaly present.  Cardiovascular: Normal rate, regular rhythm, normal heart sounds and intact distal pulses.  Exam reveals no gallop and no friction rub.   No murmur heard. Respiratory: Effort normal and breath sounds normal. No respiratory distress. She has no  wheezes. She has no rales. She exhibits no tenderness.  GI: Soft. Bowel sounds are normal. She exhibits no distension and no mass. There is tenderness. There is no rebound and no guarding.  Genitourinary:       Vulva is normal without lesions Vagina is pink moist without discharge Cervix normal in appearance and pap is normal Uterus is normal size, contour, position, consistency, mobility, non-tender Adnexa is negative with normal sized ovaries by sonogram  Musculoskeletal: Normal range of motion. She exhibits no edema and no tenderness.  Neurological: She is alert and oriented to person, place, and time. She has normal reflexes. She displays normal reflexes. No cranial nerve deficit. She exhibits normal muscle tone. Coordination normal.  Skin: Skin is warm and dry. No rash noted. No erythema. No pallor.  Psychiatric: She has a normal mood and affect. Her behavior is normal. Judgment and thought content normal.    Labs: Results for orders placed or performed during the hospital encounter of 08/05/18 (from the past 336 hour(s))  CBC   Collection Time: 08/05/18  8:57 AM  Result Value Ref Range   WBC 10.1 4.0 - 10.5 K/uL   RBC 4.61 3.87 - 5.11 MIL/uL   Hemoglobin 12.8 12.0 - 15.0 g/dL   HCT 75.9 16.3 - 84.6 %   MCV 89.2 80.0 - 100.0 fL   MCH 27.8 26.0 - 34.0 pg   MCHC 31.1 30.0 - 36.0 g/dL   RDW 65.9 93.5 - 70.1 %   Platelets 492 (H) 150 - 400 K/uL   nRBC 0.0 0.0 - 0.2 %  Comprehensive metabolic panel   Collection Time: 08/05/18  8:57 AM  Result Value Ref Range   Sodium 138 135 - 145 mmol/L   Potassium 4.0 3.5 - 5.1 mmol/L   Chloride 109 98 - 111 mmol/L   CO2 20 (L) 22 - 32 mmol/L   Glucose, Bld 82 70 - 99 mg/dL   BUN 11 6 - 20 mg/dL   Creatinine, Ser 7.79 0.44 - 1.00 mg/dL   Calcium 9.5 8.9 - 39.0 mg/dL   Total Protein 7.4 6.5 - 8.1 g/dL   Albumin 4.1 3.5 - 5.0 g/dL   AST 16 15 - 41 U/L   ALT 20 0 - 44 U/L   Alkaline Phosphatase 48 38 - 126 U/L   Total Bilirubin 0.5 0.3 -  1.2 mg/dL   GFR calc non Af Amer >60 >60 mL/min   GFR calc Af Amer >60 >60 mL/min   Anion gap 9 5 - 15  hCG, quantitative, pregnancy   Collection Time: 08/05/18  8:57 AM  Result Value Ref Range   hCG, Beta Chain, Quant, S <1 <5 mIU/mL  Rapid HIV screen (HIV 1/2 Ab+Ag)   Collection Time: 08/05/18  8:57 AM  Result Value Ref Range   HIV-1 P24 Antigen - HIV24 NON REACTIVE NON REACTIVE   HIV 1/2 Antibodies NON REACTIVE NON REACTIVE   Interpretation (HIV Ag Ab)      A  non reactive test result means that HIV 1 or HIV 2 antibodies and HIV 1 p24 antigen were not detected in the specimen.  Urinalysis, Routine w reflex microscopic   Collection Time: 08/05/18  8:57 AM  Result Value Ref Range   Color, Urine YELLOW YELLOW   APPearance CLOUDY (A) CLEAR   Specific Gravity, Urine 1.019 1.005 - 1.030   pH 5.0 5.0 - 8.0   Glucose, UA NEGATIVE NEGATIVE mg/dL   Hgb urine dipstick MODERATE (A) NEGATIVE   Bilirubin Urine NEGATIVE NEGATIVE   Ketones, ur NEGATIVE NEGATIVE mg/dL   Protein, ur NEGATIVE NEGATIVE mg/dL   Nitrite NEGATIVE NEGATIVE   Leukocytes, UA TRACE (A) NEGATIVE   RBC / HPF 0-5 0 - 5 RBC/hpf   WBC, UA 6-10 0 - 5 WBC/hpf   Bacteria, UA RARE (A) NONE SEEN   Squamous Epithelial / LPF 11-20 0 - 5   Mucus PRESENT    Hyaline Casts, UA PRESENT   Type and screen   Collection Time: 08/05/18  8:57 AM  Result Value Ref Range   ABO/RH(D) B POS    Antibody Screen NEG    Sample Expiration 08/19/2018    Extend sample reason      NO TRANSFUSIONS OR PREGNANCY IN THE PAST 3 MONTHS Performed at [facility], [address], [place], [place] [address]     EKG: No orders found for this or any previous visit.  Imaging Studies: No results found.    Assessment:   ICD-10-CM   1. Menorrhagia with irregular cycle N92.1   2. Dyspareunia in female N94.10   3. Dysmenorrhea N94.6      Plan: TVH with removal of tubes if possible  Pt understands the risks of surgery including but not  limited t  excessive bleeding requiring transfusion or reoperation, post-operative infection requiring prolonged hospitalization or re-hospitalization and antibiotic therapy, and damage to other organs including bladder, bowel, ureters and major vessels.  The patient also understands the alternative treatment options which were discussed in full.  All questions were answered.  Lazaro Arms 08/10/2018 8:18 AM   Lazaro Arms 08/10/2018 8:18 AM

## 2018-08-10 NOTE — Op Note (Signed)
Preoperative diagnosis:  1.  menometrorrhagia                                         2.  dysmenorrhea                                         3.  dyspareunia                                           Postoperative diagnosis:  Same as above   Procedure:  Vaginal hysterectomy  Surgeon:  Lazaro Arms MD  Anesthesia:  General Endotracheal  Findings:  Normal uterus tubes and ovaries.  Soft uterus maybe adenomyosis    Description of operation:  The patient was taken from the preoperative area to the operating room in stable condition. She was placed in the sitting position and underwent a spinal anesthetic. Once an adequate level of anesthesia was attained she was placed in the dorsal lithotomy position. Patient was prepped and draped in the usual sterile fashion and a Foley catheter was placed.  A weighted speculum was placed and the cervix was grasped with thyroid tenaculums both anteriorly and posteriorly.  0.5% Marcaine plain was injected in a circumferential fashion about the cervix and the electrocautery unit was used to incise the vagina and push at all cervix.  The posterior cul-de-sac was then entered sharply without difficulty.  The uterosacral ligaments were clamped cut and inspection suture ligated and held.  The cardinal ligaments were then clamped cut transfixion suture ligated and cut. The anterior peritoneum was identified the anterior cul-de-sac was entered sharply without difficulty. The anterior and posterior leaves of the broad ligament were plicated and the uterine vessels were clamped cut and suture ligated. Serial pedicles were taken of the fundus with each pedicle being clamped cut and suture ligated. The utero-ovarian ligaments were crossclamped the uterus was removed and both pedicles were transfixion suture ligated. There was good hemostasis of all the pedicles. The peritoneum was then closed in a pursestring fashion using 3-0 Vicryl. The anterior posterior vagina was closed  in interrupted fashion with good resultant hemostasis. Our closure the lower pelvis and vagina were irrigated vigorously.  The sponge needle and instrument counts were correct x 3.  Total blood loss for the procedure was 150 cc.  The patient received 2 g of Ancef and 30 mg of Toradol IV preoperatively prophylactically.  She was taken to the recovery room in good stable condition awake alert doing well.  Aimee Webb 08/10/2018, 10:12 AM

## 2018-08-10 NOTE — Anesthesia Preprocedure Evaluation (Signed)
Anesthesia Evaluation  Patient identified by MRN, date of birth, ID band Patient awake    Reviewed: Allergy & Precautions, NPO status , Patient's Chart, lab work & pertinent test results  Airway Mallampati: II  TM Distance: >3 FB Neck ROM: Full    Dental no notable dental hx. (+) Teeth Intact   Pulmonary neg pulmonary ROS, Current Smoker,    Pulmonary exam normal breath sounds clear to auscultation       Cardiovascular Exercise Tolerance: Good negative cardio ROS Normal cardiovascular examI Rhythm:Regular Rate:Normal     Neuro/Psych negative neurological ROS  negative psych ROS   GI/Hepatic negative GI ROS, Neg liver ROS,   Endo/Other  negative endocrine ROS  Renal/GU negative Renal ROS  negative genitourinary   Musculoskeletal negative musculoskeletal ROS (+)   Abdominal   Peds negative pediatric ROS (+)  Hematology negative hematology ROS (+)   Anesthesia Other Findings   Reproductive/Obstetrics negative OB ROS                             Anesthesia Physical Anesthesia Plan  ASA: II  Anesthesia Plan: General   Post-op Pain Management:    Induction: Intravenous  PONV Risk Score and Plan:   Airway Management Planned: Oral ETT  Additional Equipment:   Intra-op Plan:   Post-operative Plan: Extubation in OR  Informed Consent: I have reviewed the patients History and Physical, chart, labs and discussed the procedure including the risks, benefits and alternatives for the proposed anesthesia with the patient or authorized representative who has indicated his/her understanding and acceptance.     Dental advisory given  Plan Discussed with: CRNA  Anesthesia Plan Comments:         Anesthesia Quick Evaluation

## 2018-08-10 NOTE — Discharge Instructions (Signed)
Vaginal Hysterectomy, Care After  Refer to this sheet in the next few weeks. These instructions provide you with information about caring for yourself after your procedure. Your health care provider may also give you more specific instructions. Your treatment has been planned according to current medical practices, but problems sometimes occur. Call your health care provider if you have any problems or questions after your procedure.  What can I expect after the procedure?  After the procedure, it is common to have:  · Pain.  · Soreness and numbness in your incision areas.  · Vaginal bleeding and discharge.  · Constipation.  · Temporary problems emptying the bladder.  · Feelings of sadness or other emotions.  Follow these instructions at home:  Medicines  · Take over-the-counter and prescription medicines only as told by your health care provider.  · If you were prescribed an antibiotic medicine, take it as told by your health care provider. Do not stop taking the antibiotic even if you start to feel better.  · Do not drive or operate heavy machinery while taking prescription pain medicine.  Activity  · Return to your normal activities as told by your health care provider. Ask your health care provider what activities are safe for you.  · Get regular exercise as told by your health care provider. You may be told to take short walks every day and go farther each time.  · Do not lift anything that is heavier than 10 lb (4.5 kg).  General instructions    · Do not put anything in your vagina for 6 weeks after your surgery or as told by your health care provider. This includes tampons and douches.  · Do not have sex until your health care provider says you can.  · Do not take baths, swim, or use a hot tub until your health care provider approves.  · Drink enough fluid to keep your urine clear or pale yellow.  · Do not drive for 24 hours if you were given a sedative.  · Keep all follow-up visits as told by your health  care provider. This is important.  Contact a health care provider if:  · Your pain medicine is not helping.  · You have a fever.  · You have redness, swelling, or pain at your incision site.  · You have blood, pus, or a bad-smelling discharge from your vagina.  · You continue to have difficulty urinating.  Get help right away if:  · You have severe abdominal or back pain.  · You have heavy bleeding from your vagina.  · You have chest pain or shortness of breath.  This information is not intended to replace advice given to you by your health care provider. Make sure you discuss any questions you have with your health care provider.  Document Released: 10/28/2015 Document Revised: 12/12/2015 Document Reviewed: 07/21/2015  Elsevier Interactive Patient Education © 2019 [employer]

## 2018-08-10 NOTE — Discharge Summary (Signed)
Physician Discharge Summary  Patient ID: Aimee Webb MRN: [id number] DOB/AGE: [birth year] 38 y.o.  Admit date: 08/10/2018 Discharge date: 08/10/2018  [facility]  Discharge Diagnoses:  Active Problems:   S/P vaginal hysterectomy   Discharged Condition: good  Hospital Course: unremarkable  Consults:   Significant Diagnostic Studies: labs:   Results for orders placed or performed during the hospital encounter of 08/10/18 (from the past 24 hour(s))  CBC     Status: Abnormal   Collection Time: 08/10/18  4:13 PM  Result Value Ref Range   WBC 18.2 (H) 4.0 - 10.5 K/uL   RBC 4.16 3.87 - 5.11 MIL/uL   Hemoglobin 11.8 (L) 12.0 - 15.0 g/dL   HCT 95.637.5 38.736.0 - 56.446.0 %   MCV 90.1 80.0 - 100.0 fL   MCH 28.4 26.0 - 34.0 pg   MCHC 31.5 30.0 - 36.0 g/dL   RDW 33.215.0 95.111.5 - 88.415.5 %   Platelets 452 (H) 150 - 400 K/uL   nRBC 0.0 0.0 - 0.2 %  Basic metabolic panel     Status: Abnormal   Collection Time: 08/10/18  4:13 PM  Result Value Ref Range   Sodium 136 135 - 145 mmol/L   Potassium 4.0 3.5 - 5.1 mmol/L   Chloride 106 98 - 111 mmol/L   CO2 22 22 - 32 mmol/L   Glucose, Bld 143 (H) 70 - 99 mg/dL   BUN 9 6 - 20 mg/dL   Creatinine, Ser 1.660.75 0.44 - 1.00 mg/dL   Calcium 9.0 8.9 - 06.310.3 mg/dL   GFR calc non Af Amer >60 >60 mL/min   GFR calc Af Amer >60 >60 mL/min   Anion gap 8 5 - 15    Treatments: surgery: TVH  Discharge Exam: Blood pressure (!) 124/93, pulse 72, temperature 98.5 F (36.9 C), temperature source Oral, resp. rate 20, SpO2 100 %. General appearance: alert, cooperative and no distress GI: soft, non-tender; bowel sounds normal; no masses,  no organomegaly  Disposition: Discharge disposition: 01-Home or Self Care       Discharge Instructions    Call MD for:  persistant nausea and vomiting   Complete by:  As directed    Call MD for:  severe uncontrolled pain   Complete by:  As directed    Call MD for:  temperature >100.4   Complete by:  As  directed    Diet - low sodium heart healthy   Complete by:  As directed    Driving Restrictions   Complete by:  As directed    No driving for 72 hours   Increase activity slowly   Complete by:  As directed    Lifting restrictions   Complete by:  As directed    Do not lift more than 10 pounds for 2 weeks 20 pounds for 6 weeks   No wound care   Complete by:  As directed    Sexual Activity Restrictions   Complete by:  As directed    I know you are kidding!!!     Allergies as of 08/10/2018      Reactions   Codeine Nausea And Vomiting      Medication List    STOP taking these medications   megestrol 40 MG tablet Commonly known as:  MEGACE     TAKE these medications   ketorolac 10 MG tablet Commonly known as:  TORADOL Take 1 tablet (10 mg total) by mouth every 8 (eight) hours as needed.  ondansetron 8 MG tablet Commonly known as:  ZOFRAN Take 1 tablet (8 mg total) by mouth every 6 (six) hours as needed for nausea.   oxyCODONE-acetaminophen 5-325 MG tablet Commonly known as:  PERCOCET/ROXICET Take 1-2 tablets by mouth every 4 (four) hours as needed for moderate pain.      Follow-up Information    Lazaro ArmsEure, Kyli Sorter H, MD Follow up on 08/19/2018.   Specialties:  Obstetrics and Gynecology, Radiology Why:  post op Contact information: [address] [place] [address] [phone]           Signed: Lazaro ArmsLuther H Jaeleah Smyser 08/10/2018, 6:26 PM

## 2018-08-10 NOTE — Progress Notes (Signed)
Talked with Selena Batten at bed control. No beds available. Pt informed. Voiced understanding. Attempted to up date family. No family present.

## 2018-08-10 NOTE — Anesthesia Postprocedure Evaluation (Signed)
Anesthesia Post Note  Patient: Aimee Webb  Procedure(s) Performed: HYSTERECTOMY VAGINAL (N/A )  Patient location during evaluation: PACU Anesthesia Type: General Level of consciousness: awake and alert Pain management: pain level controlled Vital Signs Assessment: post-procedure vital signs reviewed and stable Respiratory status: spontaneous breathing, nonlabored ventilation and respiratory function stable Cardiovascular status: blood pressure returned to baseline Postop Assessment: no apparent nausea or vomiting Anesthetic complications: no     Last Vitals:  Vitals:   08/10/18 1030 08/10/18 1045  BP: 118/77 121/77  Pulse: 95 79  Resp: 18 17  Temp:    SpO2: 100% 97%    Last Pain:  Vitals:   08/10/18 1018  PainSc: 0-No pain                 [name] J

## 2018-08-10 NOTE — Progress Notes (Signed)
Resting quietly. Coke given to drink. Tolerated well. Continue waiting on bed assignment. Placed on PACU hold.

## 2018-08-10 NOTE — Transfer of Care (Signed)
Immediate Anesthesia Transfer of Care Note  Patient: Aimee Webb  Procedure(s) Performed: HYSTERECTOMY VAGINAL (N/A )  Patient Location: PACU  Anesthesia Type:General  Level of Consciousness: awake and patient cooperative  Airway & Oxygen Therapy: Patient Spontanous Breathing and Patient connected to face mask oxygen  Post-op Assessment: Report given to RN, Post -op Vital signs reviewed and stable and Patient moving all extremities  Post vital signs: Reviewed and stable  Last Vitals:  Vitals Value Taken Time  BP 118/64 08/10/2018 10:18 AM  Temp    Pulse 101 08/10/2018 10:20 AM  Resp 20 08/10/2018 10:20 AM  SpO2 100 % 08/10/2018 10:20 AM  Vitals shown include unvalidated device data.  Last Pain:  Vitals:   08/10/18 0730  PainSc: 0-No pain         Complications: No apparent anesthesia complications

## 2018-08-11 ENCOUNTER — Other Ambulatory Visit: Payer: Self-pay | Admitting: Obstetrics and Gynecology

## 2018-08-11 ENCOUNTER — Telehealth: Payer: Self-pay | Admitting: Obstetrics & Gynecology

## 2018-08-11 ENCOUNTER — Encounter (HOSPITAL_COMMUNITY): Payer: Self-pay | Admitting: Obstetrics & Gynecology

## 2018-08-11 MED ORDER — OXYCODONE-ACETAMINOPHEN 5-325 MG PO TABS: 1.0000 | ORAL_TABLET | ORAL | 0 refills | Status: DC | PRN

## 2018-08-11 NOTE — Telephone Encounter (Signed)
LMOVM.  Pt was given a prescription for percocet, sent to [facility] pharmacy. [place] pharmacy was broken in to and does not have the medication in stock. Informed pt that new rx was sent to [facility]. Advised that a note would be available for her to pick up at her convenience.

## 2018-08-11 NOTE — Progress Notes (Signed)
Oxycodone Rx sent to [facility] due to [facility] being unable to fill it.

## 2018-08-11 NOTE — Telephone Encounter (Signed)
Dr Despina Hidden was going to give her a note for work to last to next Friday the 31st and when she left hospital he forgot to give her she needs that today please call pt when ready

## 2018-08-19 ENCOUNTER — Ambulatory Visit (INDEPENDENT_AMBULATORY_CARE_PROVIDER_SITE_OTHER): Payer: Medicaid Other | Admitting: Obstetrics & Gynecology

## 2018-08-19 ENCOUNTER — Other Ambulatory Visit: Payer: Self-pay

## 2018-08-19 ENCOUNTER — Encounter: Payer: Self-pay | Admitting: Obstetrics & Gynecology

## 2018-08-19 VITALS — BP 134/79 | HR 104 | Ht 65.0 in | Wt 143.0 lb

## 2018-08-19 DIAGNOSIS — Z90710 Acquired absence of both cervix and uterus: Secondary | ICD-10-CM

## 2018-08-19 NOTE — Progress Notes (Signed)
  HPI: Patient returns for routine postoperative follow-up having undergone TVH on 1/22.  The patient's immediate postoperative recovery has been unremarkable. Since hospital discharge the patient reports no problems.   Current Outpatient Medications: ketorolac (TORADOL) 10 MG tablet, Take 1 tablet (10 mg total) by mouth every 8 (eight) hours as needed., Disp: 15 tablet, Rfl: 0 ondansetron (ZOFRAN) 8 MG tablet, Take 1 tablet (8 mg total) by mouth every 6 (six) hours as needed for nausea., Disp: 20 tablet, Rfl: 0 oxyCODONE-acetaminophen (PERCOCET/ROXICET) 5-325 MG tablet, Take 1-2 tablets by mouth every 4 (four) hours as needed for moderate pain., Disp: 30 tablet, Rfl: 0  No current facility-administered medications for this visit.     Blood pressure 134/79, pulse (!) 104, height 5\' 5"  (1.651 m), weight 143 lb (64.9 kg), last menstrual period 06/29/2018.  Physical Exam: Abdomen soft benign Vaginal cuff is healthy healing well Bimanual no cellulitis or hematoma is noted  Diagnostic Tests:   Pathology: benign  Impression: S/p TVH  Plan: No sex for at least 6-8 weeks  Follow up: 5  weeks  Lazaro ArmsLuther H Eure, MD

## 2018-09-22 ENCOUNTER — Encounter: Payer: Self-pay | Admitting: Obstetrics & Gynecology

## 2018-09-22 ENCOUNTER — Ambulatory Visit (INDEPENDENT_AMBULATORY_CARE_PROVIDER_SITE_OTHER): Payer: Medicaid Other | Admitting: Obstetrics & Gynecology

## 2018-09-22 ENCOUNTER — Other Ambulatory Visit: Payer: Self-pay

## 2018-09-22 VITALS — BP 111/85 | HR 105 | Ht 65.0 in | Wt 145.0 lb

## 2018-09-22 DIAGNOSIS — Z9889 Other specified postprocedural states: Secondary | ICD-10-CM

## 2018-09-22 DIAGNOSIS — Z90710 Acquired absence of both cervix and uterus: Secondary | ICD-10-CM

## 2018-09-22 NOTE — Progress Notes (Signed)
  HPI: Patient returns for routine postoperative follow-up having undergone TVH on 08/10/2018.  The patient's immediate postoperative recovery has been unremarkable. Since hospital discharge the patient reports doing well some discharge.   No current outpatient medications on file. No current facility-administered medications for this visit.     Blood pressure 111/85, pulse (!) 105, height 5\' 5"  (1.651 m), weight 145 lb (65.8 kg), last menstrual period 06/29/2018.  Physical Exam: Normal vagina healing Cuff with some induration remaining  Diagnostic Tests:   Pathology: benign  Impression: S/p TVH  Plan: No sex for 2-3 weeks  Gentle intercourse the first few times due to her history of dyspareunia  Follow up: 1  years Return in about 1 year (around 09/22/2019) for yearly.  Lazaro Arms, MD

## 2018-09-23 ENCOUNTER — Encounter: Payer: Medicaid Other | Admitting: Obstetrics & Gynecology

## 2018-12-20 ENCOUNTER — Telehealth: Payer: Self-pay | Admitting: Obstetrics & Gynecology

## 2018-12-20 DIAGNOSIS — K921 Melena: Secondary | ICD-10-CM | POA: Diagnosis not present

## 2018-12-20 DIAGNOSIS — R1032 Left lower quadrant pain: Secondary | ICD-10-CM | POA: Diagnosis not present

## 2018-12-20 NOTE — Telephone Encounter (Signed)
Patient called, stated she went to Dr. Tenna Delaine office today for lower abdominal pain and he suggested she had an appointment her to be referred to a gastro doctor.  She stated that she saw Dr. Despina Hidden not too long ago.  Can he make the referral or does he need to see.  [phone]

## 2018-12-21 NOTE — Telephone Encounter (Signed)
I would recommend Dr Evern Core office since that was where this evaluation took place and this was the clinical judgement

## 2018-12-22 NOTE — Telephone Encounter (Signed)
Left message letting pt know Dr. Evern Core office would need to make the referral. JSY

## 2019-01-23 DIAGNOSIS — J020 Streptococcal pharyngitis: Secondary | ICD-10-CM | POA: Diagnosis not present

## 2019-01-23 DIAGNOSIS — U071 COVID-19: Secondary | ICD-10-CM | POA: Diagnosis not present

## 2019-01-23 DIAGNOSIS — J069 Acute upper respiratory infection, unspecified: Secondary | ICD-10-CM | POA: Diagnosis not present

## 2019-06-05 DIAGNOSIS — J069 Acute upper respiratory infection, unspecified: Secondary | ICD-10-CM | POA: Diagnosis not present

## 2019-06-05 DIAGNOSIS — J329 Chronic sinusitis, unspecified: Secondary | ICD-10-CM | POA: Diagnosis not present

## 2019-06-05 DIAGNOSIS — J029 Acute pharyngitis, unspecified: Secondary | ICD-10-CM | POA: Diagnosis not present

## 2019-06-05 DIAGNOSIS — J209 Acute bronchitis, unspecified: Secondary | ICD-10-CM | POA: Diagnosis not present

## 2019-09-11 ENCOUNTER — Ambulatory Visit: Payer: Medicaid Other | Attending: Chiropractic Medicine

## 2019-09-11 ENCOUNTER — Other Ambulatory Visit: Payer: Self-pay

## 2019-09-11 DIAGNOSIS — Z20822 Contact with and (suspected) exposure to covid-19: Secondary | ICD-10-CM | POA: Diagnosis not present

## 2019-09-12 LAB — NOVEL CORONAVIRUS, NAA: SARS-CoV-2, NAA: NOT DETECTED

## 2019-11-22 ENCOUNTER — Other Ambulatory Visit: Payer: Self-pay

## 2019-11-22 ENCOUNTER — Ambulatory Visit: Payer: Medicaid Other | Attending: Internal Medicine

## 2019-11-22 DIAGNOSIS — Z20822 Contact with and (suspected) exposure to covid-19: Secondary | ICD-10-CM | POA: Diagnosis not present

## 2019-11-23 LAB — NOVEL CORONAVIRUS, NAA: SARS-CoV-2, NAA: NOT DETECTED

## 2019-11-23 LAB — SARS-COV-2, NAA 2 DAY TAT

## 2020-03-12 ENCOUNTER — Emergency Department (HOSPITAL_COMMUNITY): Payer: Medicaid Other

## 2020-03-12 ENCOUNTER — Encounter (HOSPITAL_COMMUNITY): Payer: Self-pay | Admitting: Emergency Medicine

## 2020-03-12 ENCOUNTER — Emergency Department (HOSPITAL_COMMUNITY)
Admission: EM | Admit: 2020-03-12 | Discharge: 2020-03-12 | Disposition: A | Discharge status: Home / Self Care | Payer: Medicaid Other | Attending: Emergency Medicine | Admitting: Emergency Medicine

## 2020-03-12 ENCOUNTER — Other Ambulatory Visit: Payer: Self-pay

## 2020-03-12 DIAGNOSIS — F17210 Nicotine dependence, cigarettes, uncomplicated: Secondary | ICD-10-CM | POA: Insufficient documentation

## 2020-03-12 DIAGNOSIS — R1031 Right lower quadrant pain: Secondary | ICD-10-CM | POA: Insufficient documentation

## 2020-03-12 LAB — URINALYSIS, ROUTINE W REFLEX MICROSCOPIC
Bilirubin Urine: NEGATIVE
Glucose, UA: NEGATIVE mg/dL
Ketones, ur: NEGATIVE mg/dL
Leukocytes,Ua: NEGATIVE
Nitrite: NEGATIVE
Protein, ur: NEGATIVE mg/dL
Specific Gravity, Urine: 1.018 (ref 1.005–1.030)
pH: 6 (ref 5.0–8.0)

## 2020-03-12 LAB — COMPREHENSIVE METABOLIC PANEL
ALT: 23 U/L (ref 0–44)
AST: 18 U/L (ref 15–41)
Albumin: 4.5 g/dL (ref 3.5–5.0)
Alkaline Phosphatase: 49 U/L (ref 38–126)
Anion gap: 10 (ref 5–15)
BUN: 11 mg/dL (ref 6–20)
CO2: 23 mmol/L (ref 22–32)
Calcium: 9.3 mg/dL (ref 8.9–10.3)
Chloride: 104 mmol/L (ref 98–111)
Creatinine, Ser: 0.7 mg/dL (ref 0.44–1.00)
GFR calc Af Amer: >60 mL/min (ref >60)
GFR calc non Af Amer: >60 mL/min (ref >60)
Glucose, Bld: 102 mg/dL — ABNORMAL HIGH (ref 70–99)
Potassium: 3.7 mmol/L (ref 3.5–5.1)
Sodium: 137 mmol/L (ref 135–145)
Total Bilirubin: 0.6 mg/dL (ref 0.3–1.2)
Total Protein: 7.8 g/dL (ref 6.5–8.1)

## 2020-03-12 LAB — LIPASE, BLOOD: Lipase: 25 U/L (ref 11–51)

## 2020-03-12 LAB — CBC
HCT: 43 % (ref 36.0–46.0)
Hemoglobin: 14 g/dL (ref 12.0–15.0)
MCH: 29.8 pg (ref 26.0–34.0)
MCHC: 32.6 g/dL (ref 30.0–36.0)
MCV: 91.5 fL (ref 80.0–100.0)
Platelets: 360 10*3/uL (ref 150–400)
RBC: 4.7 MIL/uL (ref 3.87–5.11)
RDW: 14 % (ref 11.5–15.5)
WBC: 12.7 10*3/uL — ABNORMAL HIGH (ref 4.0–10.5)
nRBC: 0 % (ref 0.0–0.2)

## 2020-03-12 LAB — POC URINE PREG, ED: Preg Test, Ur: NEGATIVE

## 2020-03-12 MED ORDER — ONDANSETRON 4 MG PO TBDP: 4.0000 mg | ORAL_TABLET | Freq: Three times a day (TID) | ORAL | 0 refills | Status: DC | PRN

## 2020-03-12 MED ORDER — DICYCLOMINE HCL 20 MG PO TABS: 20.0000 mg | ORAL_TABLET | Freq: Two times a day (BID) | ORAL | 0 refills | Status: DC

## 2020-03-12 MED ORDER — IOHEXOL 300 MG/ML  SOLN
100.0000 mL | Freq: Once | INTRAMUSCULAR | Status: AC | PRN
  Administered 2020-03-12: 100 mL via INTRAVENOUS

## 2020-03-12 NOTE — Discharge Instructions (Addendum)
Your CT scan is negative for the source of your pain, specifically you do not have appendicitis or any other infection which suggests the source of your symptoms.  This may be a viral illness (viral gastroenteritis) which may resolve spontaneously.  You may take the medicines prescribed if needed for nausea/vomiting (zofran) and the bentyl can help abdominal cramping.  Return here if you develop any new or worsening symptoms such as worsened pain, fevers or worsened nausea and vomiting.

## 2020-03-12 NOTE — ED Triage Notes (Signed)
Pt c/o of rt side lower abd pain for the last 2 days. Pt has had n/v from the pain but took zofran with relief.

## 2020-03-12 NOTE — ED Notes (Signed)
Pt informed of reason for wait.  Pt is resting quietly, no complaints at this time.

## 2020-03-12 NOTE — ED Notes (Signed)
Started with abdominal pain on Saturday, pain to RLQ (8/10).  Sunday had v/d for about  2 days.  Took Zofran this am and feels better, occasional sharp pain to RLQ. Denies any other symptoms.

## 2020-03-12 NOTE — ED Provider Notes (Signed)
[facility] EMERGENCY DEPARTMENT Provider Note   CSN: [id number] Arrival date & time: 03/12/20  1043     History Chief Complaint  Patient presents with  . Abdominal Pain    Aimee Webb is a 39 y.o. female with no significant past medical history, surgical history significant for partial hysterectomy and cholycystectomy presenting with a 2 day history of waxing and waning RLQ abdominal pain, sore at baseline with intermittent episodes of sharp pain in association with nausea, anorexia and diarrhea, reporting multiple episodes of non bloody diarrhea yesterday, last episode of this occurring this am.  Her pain is worsened with movement and palpation, no radiation into her back, denies dysuria or hematuria, no recognized fever but has had sweats followed by cold sweats.  She has had no treatment prior to arrival.   The history is provided by the patient.       Past Medical History:  Diagnosis Date  . Medical history non-contributory     Patient Active Problem List   Diagnosis Date Noted  . S/P vaginal hysterectomy 08/10/2018    Past Surgical History:  Procedure Laterality Date  . BREAST SURGERY    . CHOLECYSTECTOMY    . TONSILLECTOMY    . VAGINAL HYSTERECTOMY N/A 08/10/2018   Procedure: HYSTERECTOMY VAGINAL;  Surgeon: Lazaro Arms, MD;  Location: [facility];  Service: Gynecology;  Laterality: N/A;     OB History    Gravida  3   Para  3   Term  3   Preterm      AB      Living        SAB      TAB      Ectopic      Multiple      Live Births              History reviewed. No pertinent family history.  Social History   Tobacco Use  . Smoking status: Current Every Day Smoker    Packs/day: 0.25    Types: Cigarettes  . Smokeless tobacco: Never Used  Vaping Use  . Vaping Use: Never used  Substance Use Topics  . Alcohol use: No  . Drug use: No    Home Medications Prior to Admission medications   Medication Sig Start Date End Date Taking?  Authorizing Provider  dicyclomine (BENTYL) 20 MG tablet Take 1 tablet (20 mg total) by mouth 2 (two) times daily. 03/12/20   Burgess Amor, PA-C  ondansetron (ZOFRAN ODT) 4 MG disintegrating tablet Take 1 tablet (4 mg total) by mouth every 8 (eight) hours as needed for nausea or vomiting. 03/12/20   Burgess Amor, PA-C    Allergies    Codeine  Review of Systems   Review of Systems  Constitutional: Positive for appetite change, chills and diaphoresis. Negative for fever.  HENT: Negative for congestion and sore throat.   Eyes: Negative.   Respiratory: Negative for chest tightness and shortness of breath.   Cardiovascular: Negative for chest pain.  Gastrointestinal: Positive for diarrhea and nausea. Negative for abdominal pain and vomiting.  Genitourinary: Negative.   Musculoskeletal: Negative for arthralgias, joint swelling and neck pain.  Skin: Negative.  Negative for rash and wound.  Neurological: Negative for dizziness, weakness, light-headedness, numbness and headaches.  Psychiatric/Behavioral: Negative.     Physical Exam Updated Vital Signs BP 134/87 (BP Location: Right Arm)   Pulse 70   Temp 98.5 F (36.9 C) (Oral)   Resp 20   Ht 5'  4" (1.626 m)   Wt 63.5 kg   LMP 06/29/2018   SpO2 95%   BMI 24.03 kg/m   Physical Exam Vitals and nursing note reviewed.  Constitutional:      Appearance: She is well-developed.  HENT:     Head: Normocephalic and atraumatic.  Eyes:     Conjunctiva/sclera: Conjunctivae normal.  Cardiovascular:     Rate and Rhythm: Normal rate and regular rhythm.     Heart sounds: Normal heart sounds.  Pulmonary:     Effort: Pulmonary effort is normal.     Breath sounds: Normal breath sounds. No wheezing.  Abdominal:     General: Bowel sounds are normal.     Palpations: Abdomen is soft.     Tenderness: There is abdominal tenderness in the right lower quadrant. There is guarding. There is no rebound.  Musculoskeletal:        General: Normal range of  motion.     Cervical back: Normal range of motion.  Skin:    General: Skin is warm and dry.  Neurological:     General: No focal deficit present.     Mental Status: She is alert and oriented to person, place, and time.     ED Results / Procedures / Treatments   Labs (all labs ordered are listed, but only abnormal results are displayed) Labs Reviewed  COMPREHENSIVE METABOLIC PANEL - Abnormal; Notable for the following components:      Result Value   Glucose, Bld 102 (*)    All other components within normal limits  CBC - Abnormal; Notable for the following components:   WBC 12.7 (*)    All other components within normal limits  URINALYSIS, ROUTINE W REFLEX MICROSCOPIC - Abnormal; Notable for the following components:   Hgb urine dipstick MODERATE (*)    Bacteria, UA RARE (*)    All other components within normal limits  LIPASE, BLOOD  POC URINE PREG, ED    EKG None  Radiology CT ABDOMEN PELVIS W CONTRAST  Result Date: 03/12/2020 CLINICAL DATA:  39 year old female with right lower quadrant abdominal pain. EXAM: CT ABDOMEN AND PELVIS WITH CONTRAST TECHNIQUE: Multidetector CT imaging of the abdomen and pelvis was performed using the standard protocol following bolus administration of intravenous contrast. CONTRAST:  OMNIPAQUE IOHEXOL 300 MG/ML  SOLN COMPARISON:  CT abdomen pelvis dated 05/27/2018. FINDINGS: Lower chest: The visualized lung bases are clear. No intra-abdominal free air.  Small free fluid in the pelvis. Hepatobiliary: The liver is unremarkable. No intrahepatic biliary ductal dilatation with cholecystectomy. No retained calcified stone noted in the central CBD. Pancreas: Unremarkable. No pancreatic ductal dilatation or surrounding inflammatory changes. Spleen: Normal in size without focal abnormality. Adrenals/Urinary Tract: The adrenal glands unremarkable. The kidneys, visualized ureters, and urinary bladder appear unremarkable. Stomach/Bowel: There is no bowel  obstruction or active inflammation. The appendix is normal. Vascular/Lymphatic: The abdominal aorta and IVC are unremarkable. No portal venous gas. There is no adenopathy. Reproductive: Hysterectomy. There is a 2 cm left ovarian corpus luteum. The right ovary is unremarkable. Other: Small fat containing umbilical hernia. Musculoskeletal: No acute or significant osseous findings. IMPRESSION: 1. No acute intra-abdominal or pelvic pathology. No bowel obstruction. Normal appendix. 2. A 2 cm left ovarian corpus luteum. Electronically Signed   By: Elgie Collard M.D.   On: 03/12/2020 19:42    Procedures Procedures (including critical care time)  Medications Ordered in ED Medications  iohexol (OMNIPAQUE) 300 MG/ML solution 100 mL (100 mLs Intravenous Contrast Given  03/12/20 1922)    ED Course  I have reviewed the triage vital signs and the nursing notes.  Pertinent labs & imaging results that were available during my care of the patient were reviewed by me and considered in my medical decision making (see chart for details).    MDM Rules/Calculators/A&P                          Labs and imaging reviewed and discussed with patient.  Upon initial presentation my concern was for possible acute appendicitis, especially with bump in her WBC count.  All other labs were unremarkable.  Her CT scan is negative for appendicitis or other acute finding.  She does have a small fat-containing periumbilical hernia which was discussed with her.  At reexam, she has no tenderness at her umbilicus, no mass.  She was prescribed Zofran and Bentyl for symptomatic relief.  Strict return precautions were outlined.  Patient was stable at time of discharge.  The patient appears reasonably screened and/or stabilized for discharge and I doubt any other medical condition or other [facility] requiring further screening, evaluation, or treatment in the ED at this time prior to discharge.  Final Clinical Impression(s) / ED  Diagnoses Final diagnoses:  Right lower quadrant abdominal pain    Rx / DC Orders ED Discharge Orders         Ordered    ondansetron (ZOFRAN ODT) 4 MG disintegrating tablet  Every 8 hours PRN        03/12/20 2011    dicyclomine (BENTYL) 20 MG tablet  2 times daily        03/12/20 2011           Burgess Amor, Cordelia Poche 03/13/20 1407    Long, Arlyss Repress, MD 03/13/20 [phone]

## 2020-03-13 ENCOUNTER — Telehealth: Payer: Self-pay | Admitting: *Deleted

## 2020-03-13 NOTE — Telephone Encounter (Signed)
Emailed request to [facility] to schedule NP appointment.  °                       °                                              Aimee Webb °                                                PEC °                                                [phone] °

## 2020-03-13 NOTE — Telephone Encounter (Signed)
Transition Care Management Unsuccessful Follow-up Telephone Call  Date of discharge and from where:  [facility]  Attempts:  1st Attempt  Reason for unsuccessful TCM follow-up call:  Left voice message   Burnard Bunting, RN, BSN, CCRN Patient Engagement Center [phone]

## 2020-12-30 ENCOUNTER — Other Ambulatory Visit: Payer: Self-pay

## 2020-12-30 ENCOUNTER — Ambulatory Visit (INDEPENDENT_AMBULATORY_CARE_PROVIDER_SITE_OTHER): Payer: Medicaid Other | Admitting: Obstetrics & Gynecology

## 2020-12-30 ENCOUNTER — Encounter: Payer: Self-pay | Admitting: Obstetrics & Gynecology

## 2020-12-30 VITALS — BP 117/79 | HR 95 | Ht 65.0 in | Wt 160.0 lb

## 2020-12-30 DIAGNOSIS — Z01419 Encounter for gynecological examination (general) (routine) without abnormal findings: Secondary | ICD-10-CM

## 2020-12-30 NOTE — Progress Notes (Signed)
Subjective:     Aimee Webb is a 40 y.o. female here for a routine exam.  Patient's last menstrual period was 06/29/2018. G3P3000 Birth Control Method:  hysterectomy Menstrual Calendar(currently): amenorrheic  Current complaints: none.   Current acute medical issues:  none   Recent Gynecologic History Patient's last menstrual period was 06/29/2018. Last Pap: 2020,  normal Last mammogram: age 48,    Past Medical History:  Diagnosis Date   Medical history non-contributory     Past Surgical History:  Procedure Laterality Date   BREAST SURGERY     CHOLECYSTECTOMY     TONSILLECTOMY     VAGINAL HYSTERECTOMY N/A 08/10/2018   Procedure: HYSTERECTOMY VAGINAL;  Surgeon: Lazaro Arms, MD;  Location: [facility];  Service: Gynecology;  Laterality: N/A;    OB History     Gravida  3   Para  3   Term  3   Preterm      AB      Living         SAB      IAB      Ectopic      Multiple      Live Births              Social History   Socioeconomic History   Marital status: Single    Spouse name: Not on file   Number of children: Not on file   Years of education: Not on file   Highest education level: Not on file  Occupational History   Not on file  Tobacco Use   Smoking status: Every Day    Packs/day: 0.25    Pack years: 0.00    Types: Cigarettes   Smokeless tobacco: Never  Vaping Use   Vaping Use: Never used  Substance and Sexual Activity   Alcohol use: No   Drug use: No   Sexual activity: Not Currently    Birth control/protection: Surgical  Other Topics Concern   Not on file  Social History Narrative   Not on file   Social Determinants of Health   Financial Resource Strain: Not on file  Food Insecurity: Not on file  Transportation Needs: Not on file  Physical Activity: Not on file  Stress: Not on file  Social Connections: Not on file    History reviewed. No pertinent family history.  No current outpatient medications on file.  Review of  Systems  Review of Systems  Constitutional: Negative for fever, chills, weight loss, malaise/fatigue and diaphoresis.  HENT: Negative for hearing loss, ear pain, nosebleeds, congestion, sore throat, neck pain, tinnitus and ear discharge.   Eyes: Negative for blurred vision, double vision, photophobia, pain, discharge and redness.  Respiratory: Negative for cough, hemoptysis, sputum production, shortness of breath, wheezing and stridor.   Cardiovascular: Negative for chest pain, palpitations, orthopnea, claudication, leg swelling and PND.  Gastrointestinal: negative for abdominal pain. Negative for heartburn, nausea, vomiting, diarrhea, constipation, blood in stool and melena.  Genitourinary: Negative for dysuria, urgency, frequency, hematuria and flank pain.  Musculoskeletal: Negative for myalgias, back pain, joint pain and falls.  Skin: Negative for itching and rash.  Neurological: Negative for dizziness, tingling, tremors, sensory change, speech change, focal weakness, seizures, loss of consciousness, weakness and headaches.  Endo/Heme/Allergies: Negative for environmental allergies and polydipsia. Does not bruise/bleed easily.  Psychiatric/Behavioral: Negative for depression, suicidal ideas, hallucinations, memory loss and substance abuse. The patient is not nervous/anxious and does not have insomnia.  Objective:  Blood pressure 117/79, pulse 95, height 5\' 5"  (1.651 m), weight 160 lb (72.6 kg), last menstrual period 06/29/2018.   Physical Exam  Vitals reviewed. Constitutional: She is oriented to person, place, and time. She appears well-developed and well-nourished.  HENT:  Head: Normocephalic and atraumatic.        Right Ear: External ear normal.  Left Ear: External ear normal.  Nose: Nose normal.  Mouth/Throat: Oropharynx is clear and moist.  Eyes: Conjunctivae and EOM are normal. Pupils are equal, round, and reactive to light. Right eye exhibits no discharge. Left eye  exhibits no discharge. No scleral icterus.  Neck: Normal range of motion. Neck supple. No tracheal deviation present. No thyromegaly present.  Cardiovascular: Normal rate, regular rhythm, normal heart sounds and intact distal pulses.  Exam reveals no gallop and no friction rub.   No murmur heard. Respiratory: Effort normal and breath sounds normal. No respiratory distress. She has no wheezes. She has no rales. She exhibits no tenderness.  GI: Soft. Bowel sounds are normal. She exhibits no distension and no mass. There is no tenderness. There is no rebound and no guarding.  Genitourinary:  Breasts no masses skin changes or nipple changes bilaterally      Vulva is normal without lesions Vagina is pink moist without discharge Cervix absent Uterus is absent Adnexa is negative with normal sized ovaries   Musculoskeletal: Normal range of motion. She exhibits no edema and no tenderness.  Neurological: She is alert and oriented to person, place, and time. She has normal reflexes. She displays normal reflexes. No cranial nerve deficit. She exhibits normal muscle tone. Coordination normal.  Skin: Skin is warm and dry. No rash noted. No erythema. No pallor.  Psychiatric: She has a normal mood and affect. Her behavior is normal. Judgment and thought content normal.       Medications Ordered at today's visit: No orders of the defined types were placed in this encounter.   Other orders placed at today's visit: No orders of the defined types were placed in this encounter.     Assessment:    Normal Gyn exam.    Plan:    Contraception: status post hysterectomy. Follow up in: 3 years. Mammogram after turns 40      Return in about 3 years (around 12/31/2023) for yearly.

## 2021-05-23 ENCOUNTER — Ambulatory Visit
Admission: EM | Admit: 2021-05-23 | Discharge: 2021-05-23 | Disposition: A | Discharge status: Home / Self Care | Payer: Medicaid Other | Attending: Family Medicine | Admitting: Family Medicine

## 2021-05-23 ENCOUNTER — Other Ambulatory Visit: Payer: Self-pay

## 2021-05-23 DIAGNOSIS — J209 Acute bronchitis, unspecified: Secondary | ICD-10-CM | POA: Diagnosis not present

## 2021-05-23 DIAGNOSIS — J069 Acute upper respiratory infection, unspecified: Secondary | ICD-10-CM

## 2021-05-23 LAB — POCT INFLUENZA A/B
Influenza A, POC: NEGATIVE
Influenza B, POC: NEGATIVE

## 2021-05-23 MED ORDER — PREDNISONE 20 MG PO TABS: 40.0000 mg | ORAL_TABLET | Freq: Every day | ORAL | 0 refills | Status: DC

## 2021-05-23 MED ORDER — PROMETHAZINE-DM 6.25-15 MG/5ML PO SYRP: 5.0000 mL | ORAL_SOLUTION | Freq: Four times a day (QID) | ORAL | 0 refills | Status: DC | PRN

## 2021-05-23 MED ORDER — FLUTICASONE PROPIONATE 50 MCG/ACT NA SUSP: 1.0000 | Freq: Two times a day (BID) | NASAL | 2 refills | Status: AC

## 2021-05-23 NOTE — ED Triage Notes (Signed)
Patient states she felt like she had a sore throat on Monday. Sunday she had a fever at 100.5 and chills and thick mucus in the back of her throat. Feels like her head is going to explode. States she has a wet cough. Has tried mucinex and dm cough syrups all week.    Denies body aches.

## 2021-05-23 NOTE — ED Provider Notes (Signed)
[facility]    CSN: [id number] Arrival date & time: 05/23/21  3151      History   Chief Complaint No chief complaint on file.   HPI Aimee Webb is a 40 y.o. female.   Patient presenting today with 5-day history of sore throat, thick nasal congestion, hacking productive cough, chest tightness, wheezing, body aches, chills.  States she is now having sinus pressure, headache, worsened cough.  Has tried Mucinex, cough syrups, cough drops, over-the-counter pain relievers with minimal relief.  No known history of chronic pertinent medical conditions but states that she gets bronchitis or pneumonia about this time every year.  No known sick contacts recently.   Past Medical History:  Diagnosis Date   Medical history non-contributory     Patient Active Problem List   Diagnosis Date Noted   S/P vaginal hysterectomy 08/10/2018    Past Surgical History:  Procedure Laterality Date   BREAST SURGERY     CHOLECYSTECTOMY     TONSILLECTOMY     VAGINAL HYSTERECTOMY N/A 08/10/2018   Procedure: HYSTERECTOMY VAGINAL;  Surgeon: Lazaro Arms, MD;  Location: [facility];  Service: Gynecology;  Laterality: N/A;    OB History     Gravida  3   Para  3   Term  3   Preterm      AB      Living         SAB      IAB      Ectopic      Multiple      Live Births               Home Medications    Prior to Admission medications   Medication Sig Start Date End Date Taking? Authorizing Provider  fluticasone (FLONASE) 50 MCG/ACT nasal spray Place 1 spray into both nostrils 2 (two) times daily. Point the tip upward and out toward the corner of each eye when you spray 05/23/21  Yes Particia Nearing, PA-C  predniSONE (DELTASONE) 20 MG tablet Take 2 tablets (40 mg total) by mouth daily with breakfast. 05/23/21  Yes Particia Nearing, PA-C  promethazine-dextromethorphan (PROMETHAZINE-DM) 6.25-15 MG/5ML syrup Take 5 mLs by mouth 4 (four) times daily as needed for  cough. 05/23/21  Yes Particia Nearing, PA-C    Family History No family history on file.  Social History Social History   Tobacco Use   Smoking status: Every Day    Packs/day: 0.25    Types: Cigarettes   Smokeless tobacco: Never  Vaping Use   Vaping Use: Never used  Substance Use Topics   Alcohol use: No   Drug use: No     Allergies   Codeine   Review of Systems Review of Systems Per HPI  Physical Exam Triage Vital Signs ED Triage Vitals  Enc Vitals Group     BP 05/23/21 1012 124/84     Pulse Rate 05/23/21 1012 77     Resp 05/23/21 1012 18     Temp 05/23/21 1012 97.8 F (36.6 C)     Temp Source 05/23/21 1012 Oral     SpO2 05/23/21 1012 97 %     Weight --      Height --      Head Circumference --      Peak Flow --      Pain Score 05/23/21 1010 0     Pain Loc --      Pain Edu? --  Excl. in GC? --    No data found.  Updated Vital Signs BP 124/84 (BP Location: Right Arm)   Pulse 77   Temp 97.8 F (36.6 C) (Oral)   Resp 18   LMP 06/29/2018   SpO2 97%   Visual Acuity Right Eye Distance:   Left Eye Distance:   Bilateral Distance:    Right Eye Near:   Left Eye Near:    Bilateral Near:     Physical Exam Vitals and nursing note reviewed.  Constitutional:      Appearance: Normal appearance. She is not ill-appearing.  HENT:     Head: Atraumatic.     Right Ear: Tympanic membrane normal.     Left Ear: Tympanic membrane normal.     Nose: Congestion and rhinorrhea present.     Mouth/Throat:     Mouth: Mucous membranes are moist.     Pharynx: Posterior oropharyngeal erythema present.  Eyes:     Extraocular Movements: Extraocular movements intact.     Conjunctiva/sclera: Conjunctivae normal.  Cardiovascular:     Rate and Rhythm: Normal rate and regular rhythm.     Heart sounds: Normal heart sounds.  Pulmonary:     Effort: Pulmonary effort is normal. No respiratory distress.     Breath sounds: Wheezing present. No rales.     Comments:  Minimal scattered wheezes bilaterally Musculoskeletal:        General: Normal range of motion.     Cervical back: Normal range of motion and neck supple.  Skin:    General: Skin is warm and dry.  Neurological:     Mental Status: She is alert and oriented to person, place, and time.  Psychiatric:        Mood and Affect: Mood normal.        Thought Content: Thought content normal.        Judgment: Judgment normal.     UC Treatments / Results  Labs (all labs ordered are listed, but only abnormal results are displayed) Labs Reviewed  POCT INFLUENZA A/B    EKG   Radiology No results found.  Procedures Procedures (including critical care time)  Medications Ordered in UC Medications - No data to display  Initial Impression / Assessment and Plan / UC Course  I have reviewed the triage vital signs and the nursing notes.  Pertinent labs & imaging results that were available during my care of the patient were reviewed by me and considered in my medical decision making (see chart for details).     Vitals and exam overall reassuring today and she appears in no acute distress.  Suspect initially viral illness now leading to bronchitis.  May be some underlying seasonal allergy component given the seasonality of her similar illnesses every year.  Rapid flu testing negative, will forego further viral testing given the chronicity of her symptoms.  Work note given in case not improving by her next shift.  Gust over-the-counter supportive medications and home care.  Return for acutely worsening symptoms.  Final Clinical Impressions(s) / UC Diagnoses   Final diagnoses:  Viral URI with cough  Acute bronchitis, unspecified organism   Discharge Instructions   None    ED Prescriptions     Medication Sig Dispense Auth. Provider   predniSONE (DELTASONE) 20 MG tablet Take 2 tablets (40 mg total) by mouth daily with breakfast. 10 tablet Particia Nearing, PA-C    promethazine-dextromethorphan (PROMETHAZINE-DM) 6.25-15 MG/5ML syrup Take 5 mLs by mouth 4 (four) times daily  as needed for cough. 100 mL Particia Nearing, PA-C   fluticasone [facility]) 50 MCG/ACT nasal spray Place 1 spray into both nostrils 2 (two) times daily. Point the tip upward and out toward the corner of each eye when you spray 16 g Particia Nearing, [place]      PDMP not reviewed this encounter.   Particia Nearing, [place] 05/23/21 1048

## 2021-06-11 ENCOUNTER — Other Ambulatory Visit: Payer: Self-pay

## 2021-06-11 ENCOUNTER — Ambulatory Visit
Admission: RE | Admit: 2021-06-11 | Discharge: 2021-06-11 | Disposition: A | Discharge status: Home / Self Care | Payer: Medicaid Other | Source: Ambulatory Visit | Attending: Family Medicine | Admitting: Family Medicine

## 2021-06-11 VITALS — BP 135/89 | HR 105 | Temp 98.5°F | Resp 16

## 2021-06-11 DIAGNOSIS — F17210 Nicotine dependence, cigarettes, uncomplicated: Secondary | ICD-10-CM

## 2021-06-11 DIAGNOSIS — R053 Chronic cough: Secondary | ICD-10-CM | POA: Diagnosis not present

## 2021-06-11 DIAGNOSIS — R062 Wheezing: Secondary | ICD-10-CM

## 2021-06-11 MED ORDER — HYDROCODONE BIT-HOMATROP MBR 5-1.5 MG/5ML PO SOLN: 5.0000 mL | Freq: Four times a day (QID) | ORAL | 0 refills | Status: DC | PRN

## 2021-06-11 MED ORDER — ALBUTEROL SULFATE HFA 108 (90 BASE) MCG/ACT IN AERS: 1.0000 | INHALATION_SPRAY | Freq: Four times a day (QID) | RESPIRATORY_TRACT | 1 refills | Status: AC | PRN

## 2021-06-11 MED ORDER — AZITHROMYCIN 250 MG PO TABS: 250.0000 mg | ORAL_TABLET | Freq: Every day | ORAL | 0 refills | Status: DC

## 2021-06-11 MED ORDER — PREDNISONE 20 MG PO TABS: 40.0000 mg | ORAL_TABLET | Freq: Every day | ORAL | 0 refills | Status: DC

## 2021-06-11 NOTE — ED Provider Notes (Signed)
[facility]   [id number] 06/11/21 Arrival Time: 1804  ASSESSMENT & PLAN:  1. Persistent cough for 3 weeks or longer   2. Wheezing   3. Cigarette smoker    OTC symptom care as needed. Work note provided. Given duration of symptoms, will treat as below: Meds ordered this encounter  Medications   HYDROcodone bit-homatropine (HYCODAN) 5-1.5 MG/5ML syrup    Sig: Take 5 mLs by mouth every 6 (six) hours as needed for cough.    Dispense:  90 mL    Refill:  0   predniSONE (DELTASONE) 20 MG tablet    Sig: Take 2 tablets (40 mg total) by mouth daily.    Dispense:  10 tablet    Refill:  0   albuterol (VENTOLIN HFA) 108 (90 Base) MCG/ACT inhaler    Sig: Inhale 1-2 puffs into the lungs every 6 (six) hours as needed for wheezing or shortness of breath.    Dispense:  1 each    Refill:  1   azithromycin (ZITHROMAX) 250 MG tablet    Sig: Take 1 tablet (250 mg total) by mouth daily. Take first 2 tablets together, then 1 every day until finished.    Dispense:  6 tablet    Refill:  0     Follow-up Information     [facility].   Specialty: Urgent Care Why: If worsening or failing to improve as anticipated. Contact information: [address] [place] [address] [phone]                 Discharge Instructions      Be aware, your cough medication may cause drowsiness. Please do not drive, operate heavy machinery or make important decisions while on this medication, it can cloud your judgement.      Reviewed expectations re: course of current medical issues. Questions answered. Outlined signs and symptoms indicating need for more acute intervention. Understanding verbalized. After Visit Summary given.   SUBJECTIVE: History from: patient. Aimee Webb is a 40 y.o. female who reports: nasal congestion and coughing; past 3 weeks; now wheezing; no SOB. Vary fatigued. Denies: difficulty breathing.  Normal PO intake without n/v/d.   OBJECTIVE:  Vitals:   06/11/21 1824  BP: 135/89  Pulse: (!) 105  Resp: 16  Temp: 98.5 F (36.9 C)  TempSrc: Oral  SpO2: 96%    General appearance: alert; no distress Eyes: PERRLA; EOMI; conjunctiva normal HENT: [place]; AT; with nasal congestion Neck: supple  Lungs: speaks full sentences without difficulty; unlabored; dry cough; significant insp and exp wheezing Extremities: no edema Skin: warm and dry Neurologic: normal gait Psychological: alert and cooperative; normal mood and affect    Allergies  Allergen Reactions   Codeine Nausea And Vomiting    Past Medical History:  Diagnosis Date   Medical history non-contributory    Social History   Socioeconomic History   Marital status: Single    Spouse name: Not on file   Number of children: Not on file   Years of education: Not on file   Highest education level: Not on file  Occupational History   Not on file  Tobacco Use   Smoking status: Every Day    Packs/day: 0.25    Types: Cigarettes   Smokeless tobacco: Never  Vaping Use   Vaping Use: Never used  Substance and Sexual Activity   Alcohol use: No   Drug use: No   Sexual activity: Not Currently  Birth control/protection: Surgical  Other Topics Concern   Not on file  Social History Narrative   Not on file   Social Determinants of Health   Financial Resource Strain: Not on file  Food Insecurity: Not on file  Transportation Needs: Not on file  Physical Activity: Not on file  Stress: Not on file  Social Connections: Not on file  Intimate Partner Violence: Not on file   History reviewed. No pertinent family history. Past Surgical History:  Procedure Laterality Date   BREAST SURGERY     CHOLECYSTECTOMY     TONSILLECTOMY     VAGINAL HYSTERECTOMY N/A 08/10/2018   Procedure: HYSTERECTOMY VAGINAL;  Surgeon: Lazaro Arms, MD;  Location: [facility];  Service: Gynecology;  Laterality: N/AMardella Layman, MD 06/11/21  1906

## 2021-06-11 NOTE — Discharge Instructions (Signed)
Be aware, your cough medication may cause drowsiness. Please do not drive, operate heavy machinery or make important decisions while on this medication, it can cloud your judgement.  

## 2021-06-11 NOTE — ED Triage Notes (Signed)
Patient c/o nasal congestion and productive cough x "almost 3 weeks".   Patient endorses yellow sputum.   Patient endorses weakness.   Patient has taken a cough suppressant and prednisone with no relief of symptoms.

## 2021-06-18 ENCOUNTER — Ambulatory Visit (INDEPENDENT_AMBULATORY_CARE_PROVIDER_SITE_OTHER): Payer: Medicaid Other

## 2021-06-18 ENCOUNTER — Encounter: Payer: Self-pay | Admitting: Emergency Medicine

## 2021-06-18 ENCOUNTER — Ambulatory Visit
Admission: EM | Admit: 2021-06-18 | Discharge: 2021-06-18 | Disposition: A | Discharge status: Home / Self Care | Payer: Medicaid Other | Attending: Family Medicine | Admitting: Family Medicine

## 2021-06-18 ENCOUNTER — Ambulatory Visit: Payer: Self-pay

## 2021-06-18 ENCOUNTER — Other Ambulatory Visit: Payer: Self-pay

## 2021-06-18 DIAGNOSIS — R509 Fever, unspecified: Secondary | ICD-10-CM | POA: Diagnosis not present

## 2021-06-18 DIAGNOSIS — R051 Acute cough: Secondary | ICD-10-CM

## 2021-06-18 DIAGNOSIS — R059 Cough, unspecified: Secondary | ICD-10-CM | POA: Diagnosis not present

## 2021-06-18 MED ORDER — PREDNISONE 10 MG (48) PO TBPK: ORAL_TABLET | ORAL | 0 refills | Status: DC

## 2021-06-18 MED ORDER — PROMETHAZINE-DM 6.25-15 MG/5ML PO SYRP: 5.0000 mL | ORAL_SOLUTION | Freq: Four times a day (QID) | ORAL | 0 refills | Status: DC | PRN

## 2021-06-18 MED ORDER — METHYLPREDNISOLONE SODIUM SUCC 125 MG IJ SOLR
125.0000 mg | Freq: Once | INTRAMUSCULAR | Status: AC
  Administered 2021-06-18: 125 mg via INTRAMUSCULAR

## 2021-06-18 NOTE — ED Triage Notes (Signed)
Patient c/o productive cough and nasal congestion x 1 month.   Patient denies fever at home.   Patient endorses weakness.   Patient endorses SOB. Patient endorses wheezing.   Patient has taken prednisone, inhaler, and antibiotic with no relief of symptoms.

## 2021-06-18 NOTE — Discharge Instructions (Addendum)
Meds ordered this encounter  Medications   methylPREDNISolone sodium succinate (SOLU-MEDROL) 125 mg/2 mL injection 125 mg   predniSONE (STERAPRED UNI-PAK 48 TAB) 10 MG (48) TBPK tablet    Sig: Take as directed.    Dispense:  48 tablet    Refill:  0   promethazine-dextromethorphan (PROMETHAZINE-DM) 6.25-15 MG/5ML syrup    Sig: Take 5 mLs by mouth 4 (four) times daily as needed for cough.    Dispense:  118 mL    Refill:  0

## 2021-06-20 LAB — COVID-19, FLU A+B NAA
Influenza A, NAA: NOT DETECTED
Influenza B, NAA: NOT DETECTED
SARS-CoV-2, NAA: NOT DETECTED

## 2021-06-21 NOTE — ED Provider Notes (Addendum)
[facility]   [id number] 06/18/21 Arrival Time: 1550  ASSESSMENT & PLAN:  1. Fever, unspecified fever cause   2. Acute cough    I have personally viewed the imaging studies ordered this visit. No signs of PNA. Viral testing sent. Will do an extended course of prednisone to recover reactive airway causing persistent coughing. No h/O GERD. Meds ordered this encounter  Medications   methylPREDNISolone sodium succinate (SOLU-MEDROL) 125 mg/2 mL injection 125 mg   predniSONE (STERAPRED UNI-PAK 48 TAB) 10 MG (48) TBPK tablet    Sig: Take as directed.    Dispense:  48 tablet    Refill:  0   promethazine-dextromethorphan (PROMETHAZINE-DM) 6.25-15 MG/5ML syrup    Sig: Take 5 mLs by mouth 4 (four) times daily as needed for cough.    Dispense:  118 mL    Refill:  0     Follow-up Information     [facility] EMERGENCY DEPARTMENT.   Specialty: Emergency Medicine Why: If symptoms worsen in any way. Contact information: [address] [id number] [place] [address] [phone]                Reviewed expectations re: course of current medical issues. Questions answered. Outlined signs and symptoms indicating need for more acute intervention. Understanding verbalized. After Visit Summary given.   SUBJECTIVE: History from: patient. Seen recently by me; note reviewed. Completed pred and antibiotic. Aimee Webb is a 40 y.o. female who reports: continuing cough; overall past month; SOB with extended coughing spells. Denies: headache. Normal PO intake without n/v/d. With subj fever. Denies CP.  OBJECTIVE:  Vitals:   06/18/21 1614  BP: (!) 144/96  Pulse: (!) 127  Resp: 20  Temp: 99.5 F (37.5 C)  TempSrc: Oral  SpO2: 95%    Recheck HR 112 and regular.  General appearance: alert; no distress Eyes: PERRLA; EOMI; conjunctiva normal HENT: ; AT; with nasal congestion Neck: supple  Lungs: speaks full sentences without difficulty;  unlabored; active cough; still with mild to moderate wheezing throughout Extremities: no edema; no calf swelling or tenderness Skin: warm and dry Neurologic: normal gait Psychological: alert and cooperative; normal mood and affect  Labs: Results for orders placed or performed during the hospital encounter of 06/18/21  Covid-19, Flu A+B ([facility])   Specimen: Nasopharyngeal Swab   Naso  Result Value Ref Range   SARS-CoV-2, NAA Not Detected Not Detected   Influenza A, NAA Not Detected Not Detected   Influenza B, NAA Not Detected Not Detected   Test Information: Comment    Labs Reviewed  COVID-19, FLU A+B NAA   Narrative:    Performed at:  [address] [employer] [address], [place], [place]  [id number] Lab Director: Jolene Schimke MD, Phone:  [phone]    Imaging: DG Chest 2 View  Result Date: 06/18/2021 CLINICAL DATA:  Persistent cough, congestion EXAM: CHEST - 2 VIEW COMPARISON:  05/09/2016 FINDINGS: The heart size and mediastinal contours are within normal limits. Both lungs are clear. The visualized skeletal structures are unremarkable. Remote cholecystectomy noted. Nonobstructive bowel gas pattern. No acute osseous finding. IMPRESSION: No active cardiopulmonary disease. Electronically Signed   By: Judie Petit.  Shick M.D.   On: 06/18/2021 16:23    Allergies  Allergen Reactions   Codeine Nausea And Vomiting    Past Medical History:  Diagnosis Date   Medical history non-contributory    Social History   Socioeconomic History   Marital status: Single    Spouse name: Not  on file   Number of children: Not on file   Years of education: Not on file   Highest education level: Not on file  Occupational History   Not on file  Tobacco Use   Smoking status: Every Day    Packs/day: 0.25    Types: Cigarettes   Smokeless tobacco: Never  Vaping Use   Vaping Use: Never used  Substance and Sexual Activity   Alcohol use: No   Drug use: No   Sexual activity: Not Currently     Birth control/protection: Surgical  Other Topics Concern   Not on file  Social History Narrative   Not on file   Social Determinants of Health   Financial Resource Strain: Not on file  Food Insecurity: Not on file  Transportation Needs: Not on file  Physical Activity: Not on file  Stress: Not on file  Social Connections: Not on file  Intimate Partner Violence: Not on file   History reviewed. No pertinent family history. Past Surgical History:  Procedure Laterality Date   BREAST SURGERY     CHOLECYSTECTOMY     TONSILLECTOMY     VAGINAL HYSTERECTOMY N/A 08/10/2018   Procedure: HYSTERECTOMY VAGINAL;  Surgeon: Lazaro Arms, MD;  Location: [facility];  Service: Gynecology;  Laterality: N/A;     Mardella Layman, MD 06/21/21 1041    Mardella Layman, MD 06/21/21 1041    Mardella Layman, MD 06/21/21 [phone]

## 2021-07-24 ENCOUNTER — Other Ambulatory Visit (HOSPITAL_COMMUNITY): Payer: Self-pay | Admitting: Obstetrics & Gynecology

## 2021-07-24 DIAGNOSIS — Z1231 Encounter for screening mammogram for malignant neoplasm of breast: Secondary | ICD-10-CM

## 2021-08-01 ENCOUNTER — Ambulatory Visit (HOSPITAL_COMMUNITY): Payer: Medicaid Other

## 2021-08-04 ENCOUNTER — Ambulatory Visit (HOSPITAL_COMMUNITY)
Admission: RE | Admit: 2021-08-04 | Discharge: 2021-08-04 | Disposition: A | Discharge status: Home / Self Care | Payer: Medicaid Other | Source: Ambulatory Visit | Attending: Obstetrics & Gynecology | Admitting: Obstetrics & Gynecology

## 2021-08-04 ENCOUNTER — Encounter (HOSPITAL_COMMUNITY): Payer: Self-pay

## 2021-08-04 ENCOUNTER — Other Ambulatory Visit (HOSPITAL_COMMUNITY): Payer: Self-pay | Admitting: Obstetrics & Gynecology

## 2021-08-04 ENCOUNTER — Other Ambulatory Visit: Payer: Self-pay

## 2021-08-04 DIAGNOSIS — Z1231 Encounter for screening mammogram for malignant neoplasm of breast: Secondary | ICD-10-CM | POA: Diagnosis not present

## 2021-08-04 DIAGNOSIS — R928 Other abnormal and inconclusive findings on diagnostic imaging of breast: Secondary | ICD-10-CM

## 2021-08-07 ENCOUNTER — Ambulatory Visit (HOSPITAL_COMMUNITY)
Admission: RE | Admit: 2021-08-07 | Discharge: 2021-08-07 | Disposition: A | Discharge status: Home / Self Care | Payer: Medicaid Other | Source: Ambulatory Visit | Attending: Obstetrics & Gynecology | Admitting: Obstetrics & Gynecology

## 2021-08-07 ENCOUNTER — Other Ambulatory Visit: Payer: Self-pay

## 2021-08-07 DIAGNOSIS — R928 Other abnormal and inconclusive findings on diagnostic imaging of breast: Secondary | ICD-10-CM

## 2021-09-02 ENCOUNTER — Ambulatory Visit: Payer: Self-pay

## 2022-04-01 ENCOUNTER — Ambulatory Visit
Admission: RE | Admit: 2022-04-01 | Discharge: 2022-04-01 | Disposition: A | Discharge status: Home / Self Care | Payer: Medicaid Other | Source: Ambulatory Visit | Attending: Family Medicine | Admitting: Family Medicine

## 2022-04-01 VITALS — BP 114/81 | HR 85 | Temp 98.4°F | Resp 16

## 2022-04-01 DIAGNOSIS — Z20822 Contact with and (suspected) exposure to covid-19: Secondary | ICD-10-CM | POA: Insufficient documentation

## 2022-04-01 DIAGNOSIS — J029 Acute pharyngitis, unspecified: Secondary | ICD-10-CM | POA: Insufficient documentation

## 2022-04-01 DIAGNOSIS — J069 Acute upper respiratory infection, unspecified: Secondary | ICD-10-CM | POA: Diagnosis not present

## 2022-04-01 DIAGNOSIS — F17210 Nicotine dependence, cigarettes, uncomplicated: Secondary | ICD-10-CM | POA: Diagnosis not present

## 2022-04-01 LAB — POCT RAPID STREP A (OFFICE): Rapid Strep A Screen: NEGATIVE

## 2022-04-01 LAB — RESP PANEL BY RT-PCR (FLU A&B, COVID) ARPGX2
Influenza A by PCR: NEGATIVE
Influenza B by PCR: NEGATIVE
SARS Coronavirus 2 by RT PCR: NEGATIVE

## 2022-04-01 MED ORDER — PROMETHAZINE-DM 6.25-15 MG/5ML PO SYRP: 5.0000 mL | ORAL_SOLUTION | Freq: Four times a day (QID) | ORAL | 0 refills | Status: DC | PRN

## 2022-04-01 MED ORDER — MOLNUPIRAVIR EUA 200MG CAPSULE: 4.0000 | ORAL_CAPSULE | Freq: Two times a day (BID) | ORAL | 0 refills | Status: AC

## 2022-04-01 NOTE — ED Triage Notes (Signed)
Sore throat, fever, body aches since Monday.  Covid test on Tuesday was negative.  Has been taking tylenol cold and flu without relief.

## 2022-04-01 NOTE — ED Provider Notes (Signed)
[facility]    CSN: [id number] Arrival date & time: 04/01/22  0816      History   Chief Complaint Chief Complaint  Patient presents with   Cough    Sore throat soreness body aches - Entered by patient    HPI Aimee Webb is a 41 y.o. female.   Patient presenting today with 3-day history of sore throat, fever, chills, body aches, cough.  Denies chest pain, shortness of breath, abdominal pain, nausea vomiting or diarrhea.  Trying Tylenol cold and flu with minimal relief of symptoms.  Took a home COVID test 1 day post onset of symptoms which was negative.  Denies any history of pertinent chronic medical problems.    Past Medical History:  Diagnosis Date   Medical history non-contributory     Patient Active Problem List   Diagnosis Date Noted   S/P vaginal hysterectomy 08/10/2018    Past Surgical History:  Procedure Laterality Date   BREAST SURGERY     CHOLECYSTECTOMY     REDUCTION MAMMAPLASTY     TONSILLECTOMY     VAGINAL HYSTERECTOMY N/A 08/10/2018   Procedure: HYSTERECTOMY VAGINAL;  Surgeon: Lazaro Arms, MD;  Location: [facility];  Service: Gynecology;  Laterality: N/A;    OB History     Gravida  3   Para  3   Term  3   Preterm      AB      Living         SAB      IAB      Ectopic      Multiple      Live Births               Home Medications    Prior to Admission medications   Medication Sig Start Date End Date Taking? Authorizing Provider  molnupiravir EUA (LAGEVRIO) 200 mg CAPS capsule Take 4 capsules (800 mg total) by mouth 2 (two) times daily for 5 days. 04/01/22 04/06/22 Yes Particia Nearing, PA-C  albuterol (VENTOLIN HFA) 108 (90 Base) MCG/ACT inhaler Inhale 1-2 puffs into the lungs every 6 (six) hours as needed for wheezing or shortness of breath. 06/11/21   Mardella Layman, MD  fluticasone (FLONASE) 50 MCG/ACT nasal spray Place 1 spray into both nostrils 2 (two) times daily. Point the tip upward and out toward  the corner of each eye when you spray 05/23/21   Particia Nearing, PA-C  predniSONE (STERAPRED UNI-PAK 48 TAB) 10 MG (48) TBPK tablet Take as directed. 06/18/21   Mardella Layman, MD  promethazine-dextromethorphan (PROMETHAZINE-DM) 6.25-15 MG/5ML syrup Take 5 mLs by mouth 4 (four) times daily as needed for cough. 04/01/22   Particia Nearing, PA-C    Family History History reviewed. No pertinent family history.  Social History Social History   Tobacco Use   Smoking status: Every Day    Packs/day: 0.25    Types: Cigarettes   Smokeless tobacco: Never  Vaping Use   Vaping Use: Never used  Substance Use Topics   Alcohol use: No   Drug use: No     Allergies   Codeine   Review of Systems Review of Systems Per HPI  Physical Exam Triage Vital Signs ED Triage Vitals  Enc Vitals Group     BP 04/01/22 0827 114/81     Pulse Rate 04/01/22 0827 85     Resp 04/01/22 0827 16     Temp 04/01/22 0827 98.4 F (36.9 C)  Temp Source 04/01/22 0827 Oral     SpO2 04/01/22 0827 97 %     Weight --      Height --      Head Circumference --      Peak Flow --      Pain Score 04/01/22 0828 7     Pain Loc --      Pain Edu? --      Excl. in GC? --    No data found.  Updated Vital Signs BP 114/81 (BP Location: Right Arm)   Pulse 85   Temp 98.4 F (36.9 C) (Oral)   Resp 16   LMP 06/29/2018   SpO2 97%   Visual Acuity Right Eye Distance:   Left Eye Distance:   Bilateral Distance:    Right Eye Near:   Left Eye Near:    Bilateral Near:     Physical Exam Vitals and nursing note reviewed.  Constitutional:      Appearance: Normal appearance.  HENT:     Head: Atraumatic.     Right Ear: Tympanic membrane and external ear normal.     Left Ear: Tympanic membrane and external ear normal.     Nose: Rhinorrhea present.     Mouth/Throat:     Mouth: Mucous membranes are moist.     Pharynx: Posterior oropharyngeal erythema present.  Eyes:     Extraocular Movements:  Extraocular movements intact.     Conjunctiva/sclera: Conjunctivae normal.  Cardiovascular:     Rate and Rhythm: Normal rate and regular rhythm.     Heart sounds: Normal heart sounds.  Pulmonary:     Effort: Pulmonary effort is normal.     Breath sounds: Normal breath sounds. No wheezing or rales.  Musculoskeletal:        General: Normal range of motion.     Cervical back: Normal range of motion and neck supple.  Lymphadenopathy:     Cervical: No cervical adenopathy.  Skin:    General: Skin is warm and dry.  Neurological:     Mental Status: Aimee Webb is alert and oriented to person, place, and time.  Psychiatric:        Mood and Affect: Mood normal.        Thought Content: Thought content normal.      UC Treatments / Results  Labs (all labs ordered are listed, but only abnormal results are displayed) Labs Reviewed  CULTURE, GROUP A STREP ([facility])  RESP PANEL BY RT-PCR (FLU A&B, COVID) [id number]  POCT RAPID STREP A (OFFICE)    EKG   Radiology No results found.  Procedures Procedures (including critical care time)  Medications Ordered in UC Medications - No data to display  Initial Impression / Assessment and Plan / UC Course  I have reviewed the triage vital signs and the nursing notes.  Pertinent labs & imaging results that were available during my care of the patient were reviewed by me and considered in my medical decision making (see chart for details).     Rapid strep negative, throat culture and respiratory panel pending.  Vital signs and exam overall very reassuring today and suspicious for a viral upper respiratory infection.  Treat with Phenergan DM, supportive over-the-counter medications and home care.  Given suspicion for COVID-19 based on symptoms, will also start molnupiravir while awaiting respiratory panel.  May stop if negative.  Note given.  Final Clinical Impressions(s) / UC Diagnoses   Final diagnoses:  Viral URI with cough   Discharge Instructions  None    ED Prescriptions     Medication Sig Dispense Auth. Provider   promethazine-dextromethorphan (PROMETHAZINE-DM) 6.25-15 MG/5ML syrup Take 5 mLs by mouth 4 (four) times daily as needed for cough. 118 mL Particia Nearing, PA-C   molnupiravir EUA (LAGEVRIO) 200 mg CAPS capsule Take 4 capsules (800 mg total) by mouth 2 (two) times daily for 5 days. 40 capsule Particia Nearing, [place]      PDMP not reviewed this encounter.   Particia Nearing, [place] 04/01/22 1455

## 2022-04-04 LAB — CULTURE, GROUP A STREP (THRC)

## 2022-07-21 ENCOUNTER — Encounter (HOSPITAL_COMMUNITY): Payer: Self-pay | Admitting: Obstetrics & Gynecology

## 2022-07-21 ENCOUNTER — Other Ambulatory Visit (HOSPITAL_COMMUNITY): Payer: Self-pay | Admitting: Obstetrics & Gynecology

## 2022-07-21 DIAGNOSIS — Z1231 Encounter for screening mammogram for malignant neoplasm of breast: Secondary | ICD-10-CM

## 2022-07-24 ENCOUNTER — Ambulatory Visit
Admission: RE | Admit: 2022-07-24 | Discharge: 2022-07-24 | Disposition: A | Discharge status: Home / Self Care | Payer: Medicaid Other | Source: Ambulatory Visit | Attending: Nurse Practitioner

## 2022-07-24 VITALS — BP 134/72 | HR 73 | Temp 98.3°F | Resp 18

## 2022-07-24 DIAGNOSIS — R062 Wheezing: Secondary | ICD-10-CM | POA: Diagnosis not present

## 2022-07-24 DIAGNOSIS — H66002 Acute suppurative otitis media without spontaneous rupture of ear drum, left ear: Secondary | ICD-10-CM

## 2022-07-24 DIAGNOSIS — J209 Acute bronchitis, unspecified: Secondary | ICD-10-CM | POA: Diagnosis not present

## 2022-07-24 MED ORDER — METHYLPREDNISOLONE SODIUM SUCC 125 MG IJ SOLR
60.0000 mg | Freq: Once | INTRAMUSCULAR | Status: AC
  Administered 2022-07-24: 60 mg via INTRAMUSCULAR

## 2022-07-24 MED ORDER — IPRATROPIUM-ALBUTEROL 0.5-2.5 (3) MG/3ML IN SOLN
3.0000 mL | Freq: Once | RESPIRATORY_TRACT | Status: AC
  Administered 2022-07-24: 3 mL via RESPIRATORY_TRACT

## 2022-07-24 MED ORDER — PREDNISONE 20 MG PO TABS: 40.0000 mg | ORAL_TABLET | Freq: Every day | ORAL | 0 refills | Status: AC

## 2022-07-24 MED ORDER — AMOXICILLIN-POT CLAVULANATE 875-125 MG PO TABS: 1.0000 | ORAL_TABLET | Freq: Two times a day (BID) | ORAL | 0 refills | Status: AC

## 2022-07-24 NOTE — ED Triage Notes (Signed)
Pt reports cough, fatigue and discomfort in chest and back when coughing. Reports she had the Flu on 07/13/22. OTC meds and promethazine gives no relief. Pt finished Tamiflu on 07/20/22.

## 2022-07-24 NOTE — Discharge Instructions (Signed)
I suspect you have acute bronchitis secondary to a viral illness.  We have given you a DuoNeb breathing treatment today which helped open up your airway, continue to use albuterol inhaler every 4-6 hours as needed at home to help with wheezing or shortness of breath/chest tightness.  We also gave you a shot of Solu-Medrol which is a steroid medication and helps with inflammation in your chest.  Please start the prednisone tomorrow morning.  For the ear infection, please take the Augmentin as prescribed.  This will also help if there is any infection in your chest, although I have low suspicion for that today.  Seek care if your symptoms persist or worsen despite treatment.

## 2022-07-24 NOTE — ED Provider Notes (Signed)
[facility]    CSN: [id number] Arrival date & time: 07/24/22  4742      History   Chief Complaint Chief Complaint  Patient presents with   Cough    Had flu at Christmas got cough and achy still chest back hurts from cough - Entered by patient   Appointment    0930    HPI Aimee Webb is a 42 y.o. female.   Patient presents today for for 11 day history of productive cough, wheezing, chest pain when coughing, chest tightness, chest and nasal congestion, sneezing, sinus pressure at night, right ear pain that is now improved, fever that is now improved, decreased appetite, and fatigue.  She denies shortness of breath or chest pain at rest.  No runny nose, headache, tooth pain, abdominal pain, nausea/vomiting, diarrhea.  Reports she and her daughters have all tested positive for influenza about 1.5 weeks ago; her daughter is also currently have strep throat.  She has been using albuterol inhaler for the wheezing and chest tightness which does not help very much.  Also finished a full course of Tamiflu, cough syrup, Delsym, Vicks, and has tried honey without much benefit.  Patient denies history of asthma or chronic lung disease like COPD.  Reports a smoking history, quit smoking 2 years ago per her report.  She smoked about a quart a pack per day for about 16 years.    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 Authorizing Provider  amoxicillin-clavulanate (AUGMENTIN) 875-125 MG tablet Take 1 tablet by mouth 2 (two) times daily for 7 days. 07/24/22 07/31/22 Yes Eulogio Bear, NP  predniSONE (DELTASONE) 20 MG tablet Take 2 tablets (40 mg total) by mouth daily with breakfast for 5 days. 07/24/22 07/29/22 Yes Eulogio Bear, NP  albuterol (VENTOLIN HFA) 108 (90 Base) MCG/ACT inhaler Inhale 1-2 puffs into the lungs every 6 (six) hours as needed for wheezing or shortness of breath. 06/11/21   Vanessa Kick, MD  fluticasone (FLONASE) 50 MCG/ACT nasal spray Place 1 spray into both nostrils 2 (two) times daily. Point the tip upward and out toward the corner of each eye when you spray 05/23/21   Volney American, PA-C    Family History History reviewed. No pertinent family history.  Social History Social History   Tobacco Use   Smoking status: Every Day    Packs/day: 0.25    Types: Cigarettes  Smokeless tobacco: Never  Vaping Use   Vaping Use: Never used  Substance Use Topics   Alcohol use: No   Drug use: No     Allergies   Codeine   Review of Systems Review of Systems Per HPI  Physical Exam Triage Vital Signs ED Triage Vitals  Enc Vitals Group     BP 07/24/22 1020 134/72     Pulse Rate 07/24/22 1020 73     Resp 07/24/22 1020 18     Temp 07/24/22 1020 98.3 F (36.8 C)     Temp Source 07/24/22 1020 Oral     SpO2 07/24/22 1020 98 %     Weight --      Height --      Head Circumference --      Peak Flow --      Pain Score 07/24/22 1022 0     Pain Loc --      Pain Edu? --      Excl. in [place]? --    No data found.  Updated Vital Signs BP 134/72 (BP Location: Right Arm)   Pulse 73   Temp 98.3 F (36.8 C) (Oral)   Resp 18   LMP 06/29/2018   SpO2 98%   Visual Acuity Right Eye Distance:   Left Eye Distance:    Bilateral Distance:    Right Eye Near:   Left Eye Near:    Bilateral Near:     Physical Exam Vitals and nursing note reviewed.  Constitutional:      General: She is not in acute distress.    Appearance: Normal appearance. She is not ill-appearing or toxic-appearing.  HENT:     Head: Normocephalic and atraumatic.     Right Ear: Tympanic membrane, ear canal and external ear normal.     Left Ear: Ear canal and external ear normal. Tympanic membrane is erythematous.     Nose: Congestion and rhinorrhea present.     Mouth/Throat:     Mouth: Mucous membranes are moist.     Pharynx: Oropharynx is clear. No oropharyngeal exudate or posterior oropharyngeal erythema.  Eyes:     General: No scleral icterus.    Extraocular Movements: Extraocular movements intact.  Cardiovascular:     Rate and Rhythm: Normal rate and regular rhythm.     Heart sounds: No murmur heard. Pulmonary:     Effort: Pulmonary effort is normal. No respiratory distress.     Breath sounds: Decreased air movement present. Wheezing present. No rhonchi or rales.  Abdominal:     General: Abdomen is flat. Bowel sounds are normal. There is no distension.     Palpations: Abdomen is soft.  Musculoskeletal:     Cervical back: Normal range of motion and neck supple.  Lymphadenopathy:     Cervical: No cervical adenopathy.  Skin:    General: Skin is warm and dry.     Coloration: Skin is not jaundiced or pale.     Findings: No erythema or rash.  Neurological:     Mental Status: She is alert and oriented to person, place, and time.  Psychiatric:        Behavior: Behavior is cooperative.      UC Treatments / Results  Labs (all labs ordered are listed, but only abnormal results are displayed) Labs Reviewed - No data to display  EKG   Radiology No results found.  Procedures Procedures (including critical care time)  Medications Ordered in UC Medications  ipratropium-albuterol (DUONEB) 0.5-2.5 (3)  MG/3ML  nebulizer solution 3 mL (3 mLs Nebulization Given 07/24/22 1103)  methylPREDNISolone sodium succinate (SOLU-MEDROL) 125 mg/2 mL injection 60 mg (60 mg Intramuscular Given 07/24/22 1123)    Initial Impression / Assessment and Plan / UC Course  I have reviewed the triage vital signs and the nursing notes.  Pertinent labs & imaging results that were available during my care of the patient were reviewed by me and considered in my medical decision making (see chart for details).   Patient is well-appearing, normotensive, afebrile, not tachycardic, not tachypneic, oxygenating well on room air.    Non-recurrent acute suppurative otitis media of left ear without spontaneous rupture of tympanic membrane Treat with Augmentin twice daily for 7 days Supportive care discussed   Wheezing Acute bronchitis, unspecified organism Suspect secondary to viral illness DuoNeb given in urgent care today with improvement in aeration bilaterally, wheezes present post DuoNeb Treat with Solu-Medrol 60 mg IM today in urgent care, start oral prednisone tomorrow Recommended use of albuterol inhaler every 4-6 hours as needed, Mucinex 600 mg twice daily I have low suspicion for bacterial lower respiratory infection today ER and return precautions discussed Recommended follow-up with primary care provider if no improvement in symptoms despite treatment Note given for work  The patient was given the opportunity to ask questions.  All questions answered to their satisfaction.  The patient is in agreement to this plan.    Final Clinical Impressions(s) / UC Diagnoses   Final diagnoses:  Non-recurrent acute suppurative otitis media of left ear without spontaneous rupture of tympanic membrane  Wheezing  Acute bronchitis, unspecified organism     Discharge Instructions      I suspect you have acute bronchitis secondary to a viral illness.  We have given you a DuoNeb breathing treatment today which helped open up your  airway, continue to use albuterol inhaler every 4-6 hours as needed at home to help with wheezing or shortness of breath/chest tightness.  We also gave you a shot of Solu-Medrol which is a steroid medication and helps with inflammation in your chest.  Please start the prednisone tomorrow morning.  For the ear infection, please take the Augmentin as prescribed.  This will also help if there is any infection in your chest, although I have low suspicion for that today.  Seek care if your symptoms persist or worsen despite treatment.     ED Prescriptions     Medication Sig Dispense Auth. Provider   predniSONE (DELTASONE) 20 MG tablet Take 2 tablets (40 mg total) by mouth daily with breakfast for 5 days. 10 tablet Noemi Chapel A, NP   amoxicillin-clavulanate (AUGMENTIN) 875-125 MG tablet Take 1 tablet by mouth 2 (two) times daily for 7 days. 14 tablet Eulogio Bear, NP      PDMP not reviewed this encounter.   Eulogio Bear, NP 07/24/22 1210

## 2022-08-07 ENCOUNTER — Ambulatory Visit (HOSPITAL_COMMUNITY)
Admission: RE | Admit: 2022-08-07 | Discharge: 2022-08-07 | Disposition: A | Discharge status: Home / Self Care | Payer: Medicaid Other | Source: Ambulatory Visit | Attending: Obstetrics & Gynecology | Admitting: Obstetrics & Gynecology

## 2022-08-07 DIAGNOSIS — Z1231 Encounter for screening mammogram for malignant neoplasm of breast: Secondary | ICD-10-CM

## 2022-09-06 IMAGING — MG MM DIGITAL DIAGNOSTIC UNILAT*L* W/ TOMO W/ CAD
8 series · 8 of 24 positions shown · non-contrast
Comparison: Screening exam, 08/04/2021, which was the baseline
study.

CLINICAL DATA: Screening recall for a possible left breast mass.

EXAM:
DIGITAL DIAGNOSTIC UNILATERAL LEFT MAMMOGRAM WITH TOMOSYNTHESIS AND
CAD; ULTRASOUND LEFT BREAST LIMITED
TECHNIQUE: Left digital diagnostic mammography and breast tomosynthesis was
performed. The images were evaluated with computer-aided detection.;
Targeted ultrasound examination of the left breast was performed.

[L CC synth-2D]
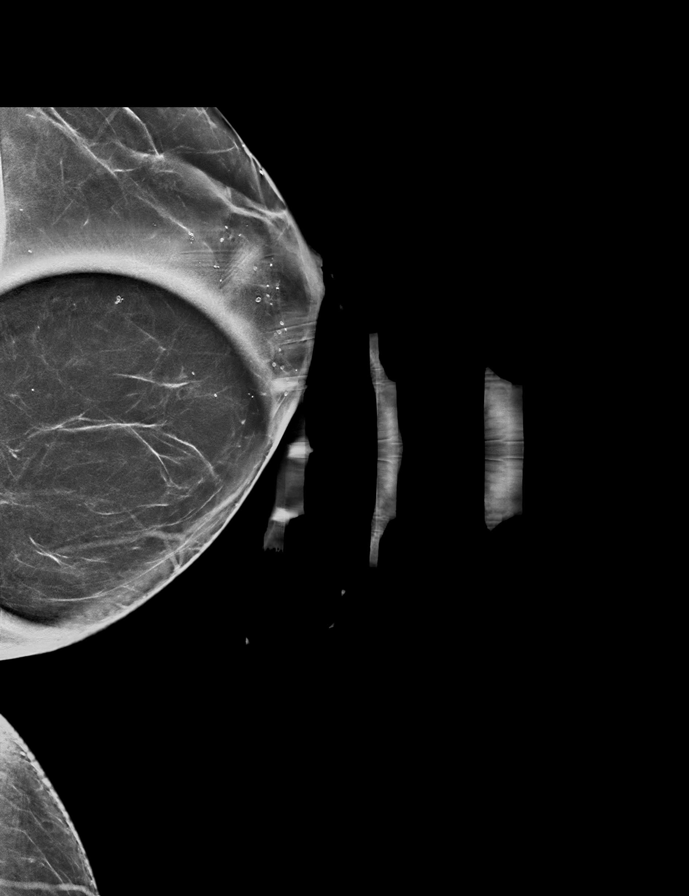

[L MLO synth-2D]
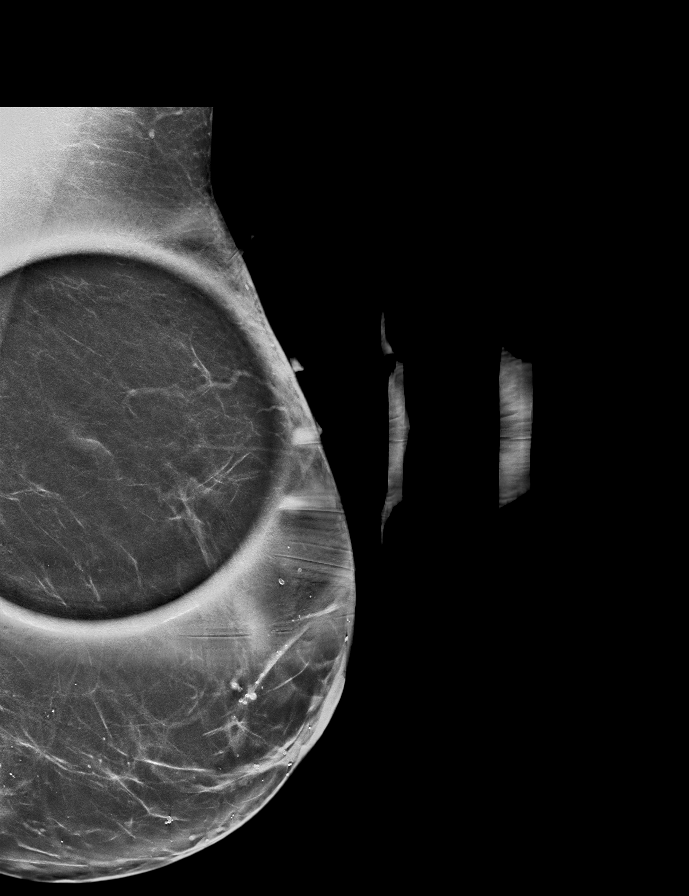

[L XCCL synth-2D (1 of 2)]
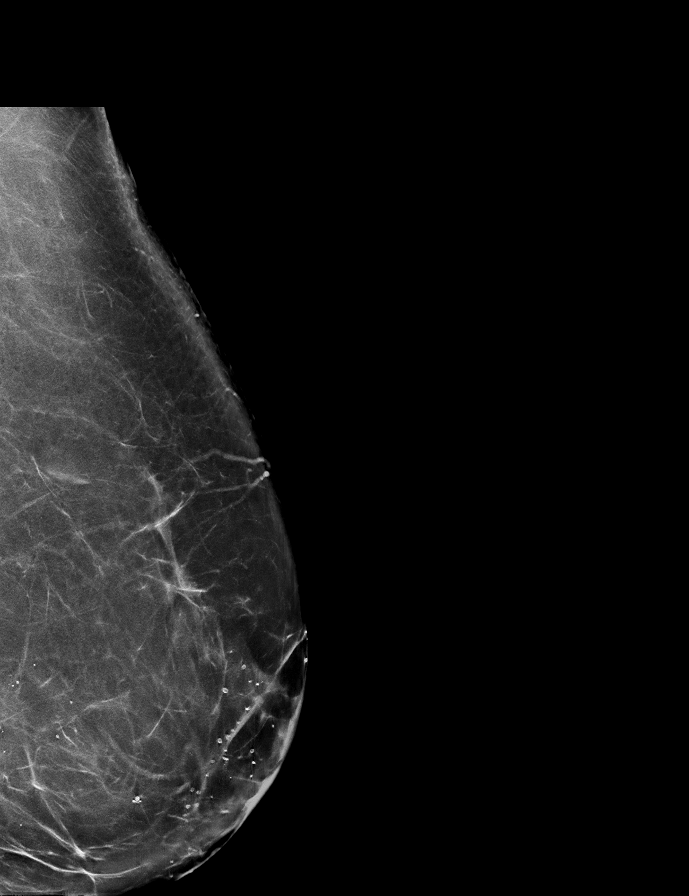

[L XCCL synth-2D (2 of 2)]
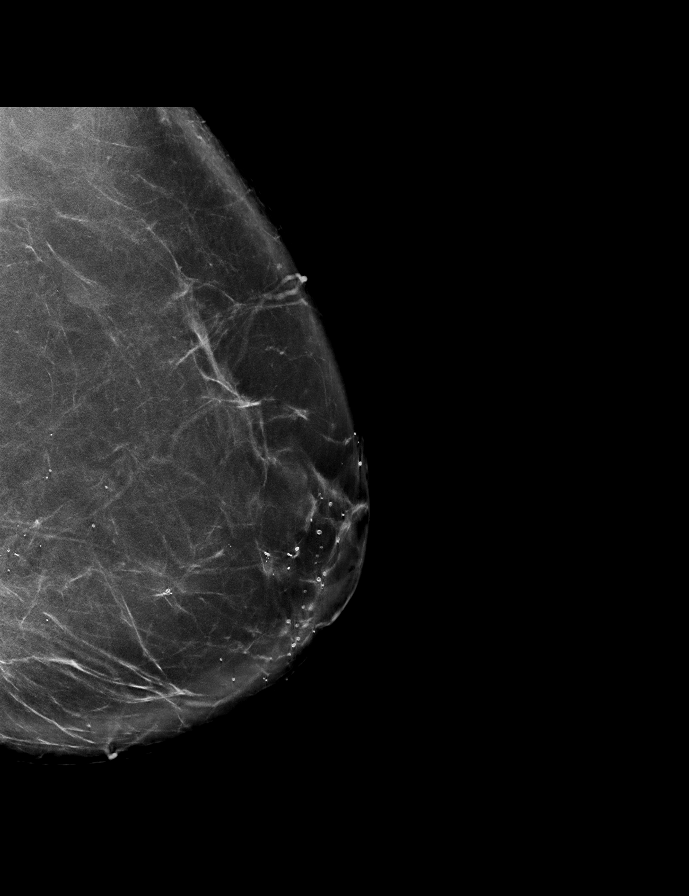

[L CC tomo · tomo slice 35/70.0]
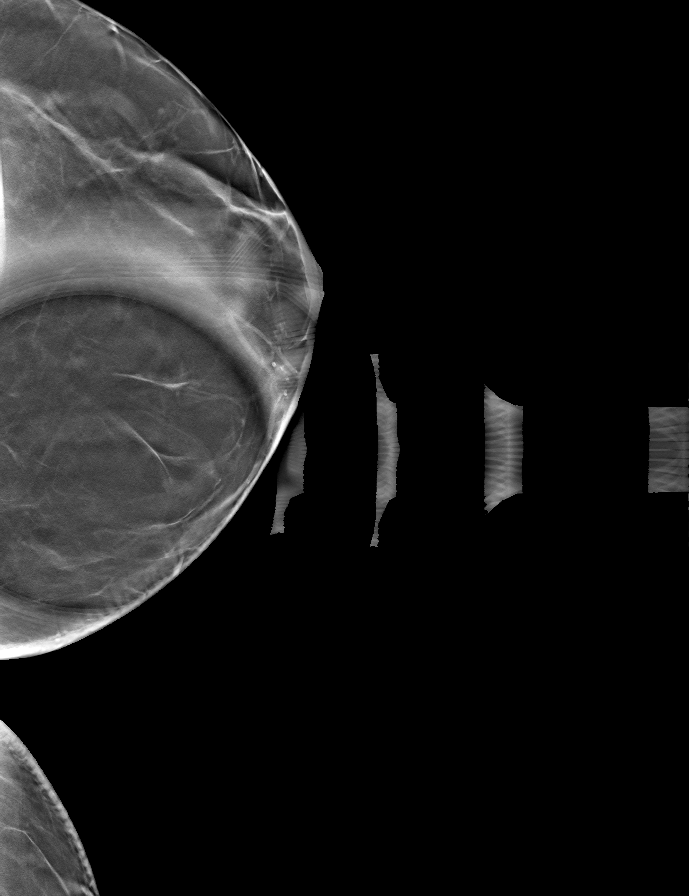

[L XCCL tomo (1 of 2) · tomo slice 46/91.0]
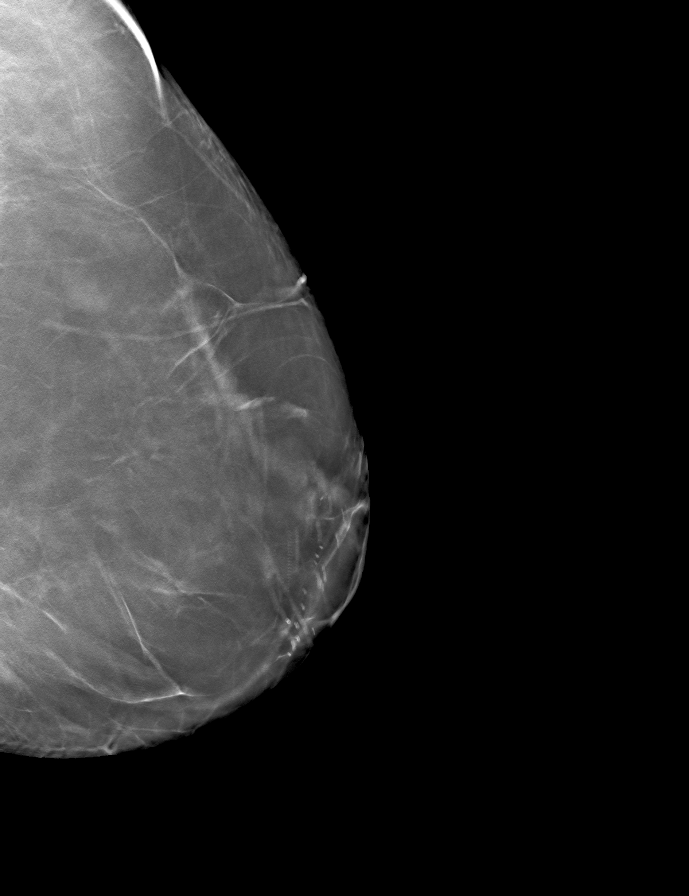

[L MLO tomo · tomo slice 42/83.0]
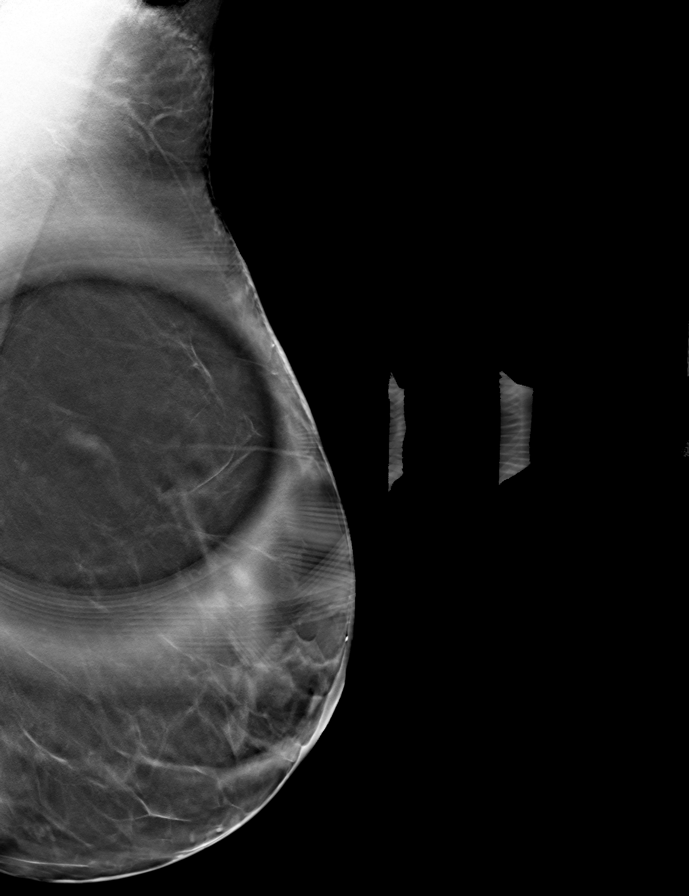

[L XCCL tomo (2 of 2) · tomo slice 46/91.0]
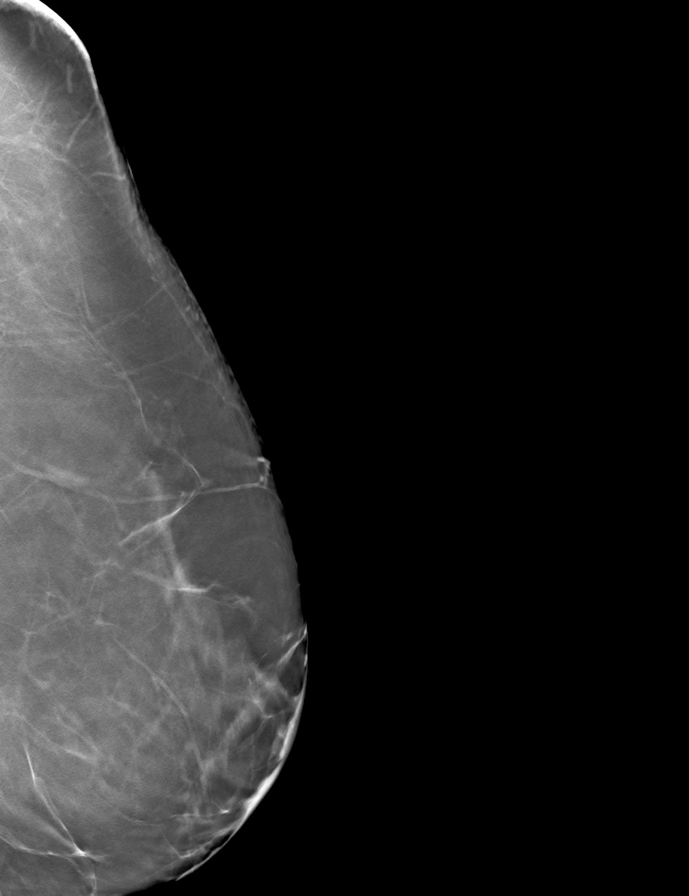

[8 of 24 positions shown; findings below may reference images not displayed]

ACR Breast Density Category b: There are scattered areas of
fibroglandular density.
FINDINGS: On the diagnostic images, the possible mass noted in the left breast
on the current screening exam persists as an oval, partly
circumscribed mass in the upper outer breast, posterior depth,
measuring approximately 9 x 3 x 6 mm. It has a reniform
configuration on the exaggerated craniocaudal view suggesting
intramammary lymph node.

Targeted left breast ultrasound is performed, showing a subtle,
nearly isoechoic, 8 x 3 x 7 mm lymph node at 2:30 o'clock, 6 cm the
nipple, consistent in size and location to the mammographic mass.
There no other abnormalities masses within the upper outer left
breast.
IMPRESSION: 1. No evidence of breast malignancy.
2. Benign left breast intramammary lymph node.

RECOMMENDATION:
Screening mammogram in one year.(Code:W9-C-BJJ)

I have discussed the findings and recommendations with the patient.
If applicable, a reminder letter will be sent to the patient
regarding the next appointment.

BI-RADS CATEGORY  2: Benign.

## 2023-05-13 ENCOUNTER — Ambulatory Visit: Payer: Medicaid Other

## 2023-08-16 ENCOUNTER — Other Ambulatory Visit (HOSPITAL_COMMUNITY): Payer: Self-pay | Admitting: Obstetrics & Gynecology

## 2023-08-16 DIAGNOSIS — Z1231 Encounter for screening mammogram for malignant neoplasm of breast: Secondary | ICD-10-CM

## 2023-08-23 ENCOUNTER — Ambulatory Visit (HOSPITAL_COMMUNITY)
Admission: RE | Admit: 2023-08-23 | Discharge: 2023-08-23 | Disposition: A | Discharge status: Home / Self Care | Payer: Self-pay | Source: Ambulatory Visit | Attending: Obstetrics & Gynecology | Admitting: Obstetrics & Gynecology

## 2023-08-23 ENCOUNTER — Encounter (HOSPITAL_COMMUNITY): Payer: Self-pay

## 2023-08-23 DIAGNOSIS — Z1231 Encounter for screening mammogram for malignant neoplasm of breast: Secondary | ICD-10-CM | POA: Insufficient documentation

## 2023-08-25 ENCOUNTER — Encounter: Payer: Self-pay | Admitting: Obstetrics & Gynecology

## 2024-06-29 ENCOUNTER — Emergency Department (HOSPITAL_COMMUNITY)
Admission: EM | Admit: 2024-06-29 | Discharge: 2024-06-29 | Disposition: A | Discharge status: Home / Self Care | Payer: Self-pay | Attending: Emergency Medicine | Admitting: Emergency Medicine

## 2024-06-29 ENCOUNTER — Encounter (HOSPITAL_COMMUNITY): Payer: Self-pay

## 2024-06-29 ENCOUNTER — Other Ambulatory Visit: Payer: Self-pay

## 2024-06-29 DIAGNOSIS — S61011A Laceration without foreign body of right thumb without damage to nail, initial encounter: Secondary | ICD-10-CM

## 2024-06-29 MED ORDER — LIDOCAINE-EPINEPHRINE 2 %-1:100000 IJ SOLN: 20.0000 mL | Freq: Once | INTRAMUSCULAR | Status: DC

## 2024-06-29 MED ORDER — LIDOCAINE-EPINEPHRINE (PF) 2 %-1:200000 IJ SOLN
20.0000 mL | Freq: Once | INTRAMUSCULAR | Status: AC
  Administered 2024-06-29: 20 mL via INTRADERMAL
  Filled 2024-06-29: qty 20

## 2024-06-29 MED ORDER — TETANUS-DIPHTH-ACELL PERTUSSIS 5-2-15.5 LF-MCG/0.5 IM SUSP
0.5000 mL | Freq: Once | INTRAMUSCULAR | Status: AC
  Administered 2024-06-29: 0.5 mL via INTRAMUSCULAR
  Filled 2024-06-29: qty 0.5

## 2024-06-29 NOTE — ED Provider Notes (Signed)
°.  Laceration Repair  Date/Time: 06/29/2024 2:49 PM  Performed by: Rosaline Almarie MATSU, PA-C Authorized by: Rosaline Almarie MATSU, PA-C   Consent:    Consent obtained:  Verbal   Consent given by:  Patient   Risks, benefits, and alternatives were discussed: yes     Risks discussed:  Infection, need for additional repair, nerve damage, poor wound healing, poor cosmetic result, pain, retained foreign body, tendon damage and vascular damage   Alternatives discussed:  No treatment, delayed treatment, observation and referral Universal protocol:    Procedure explained and questions answered to patient or proxy's satisfaction: yes     Relevant documents present and verified: yes     Test results available: yes     Imaging studies available: yes     Required blood products, implants, devices, and special equipment available: yes     Site/side marked: yes     Patient identity confirmed:  Verbally with patient Anesthesia:    Anesthesia method:  Local infiltration   Local anesthetic:  Lidocaine  1% w/o epi Laceration details:    Location:  Finger   Finger location:  L thumb   Length (cm):  2 Pre-procedure details:    Preparation:  Patient was prepped and draped in usual sterile fashion Exploration:    Limited defect created (wound extended): no   Treatment:    Area cleansed with:  Povidone-iodine   Amount of cleaning:  Standard   Irrigation solution:  Sterile saline   Irrigation method:  Syringe   Visualized foreign bodies/material removed: no     Debridement:  None   Undermining:  None   Scar revision: no   Skin repair:    Repair method:  Sutures   Suture size:  4-0   Suture material:  Prolene   Suture technique:  Simple interrupted   Number of sutures:  5 Approximation:    Approximation:  Close Repair type:    Repair type:  Simple Post-procedure details:    Dressing:  Non-adherent dressing   Procedure completion:  Tolerated       Rosaline Almarie MATSU, PA-C 06/29/24  1451    Cleotilde Rogue, MD 07/02/24 1511

## 2024-06-29 NOTE — ED Provider Notes (Signed)
  EMERGENCY DEPARTMENT AT [facility] Provider Note   CSN: [id number] Arrival date & time: 06/29/24  1333     Patient presents with: Laceration (Right thumb)   Aimee Webb is a 43 y.o. female.  {Add pertinent medical, surgical, social history, OB history to HPI:32947}  Laceration  This patient is a 43 year old female presenting from work after she had a single laceration to her right thumb over the dorsal aspect of the proximal phalanx just distal to the MCP.  This occurred about a 1 hour ago, she has some numbness to the thumb, she is left-hand dominant.  She is not up-to-date on tetanus    Prior to Admission medications  Medication Sig Start Date End Date Taking? Authorizing Provider  albuterol  (VENTOLIN  HFA) 108 (90 Base) MCG/ACT inhaler Inhale 1-2 puffs into the lungs every 6 (six) hours as needed for wheezing or shortness of breath. 06/11/21   Rolinda Rogue, MD  fluticasone  (FLONASE ) 50 MCG/ACT nasal spray Place 1 spray into both nostrils 2 (two) times daily. Point the tip upward and out toward the corner of each eye when you spray 05/23/21   Stuart Vernell Norris, PA-C    Allergies: Codeine    Review of Systems  Skin:  Positive for wound.    Updated Vital Signs BP (!) 142/95 (BP Location: Left Arm)   Pulse 99   Temp 98.5 F (36.9 C) (Oral)   Resp 18   Ht 1.651 m (5' 5)   Wt 72.6 kg   LMP 06/29/2018   SpO2 100%   BMI 26.63 kg/m   Physical Exam Constitutional:      General: She is not in acute distress.    Appearance: She is well-developed. She is not diaphoretic.  HENT:     Head: Normocephalic.  Eyes:     General: No scleral icterus.    Conjunctiva/sclera: Conjunctivae normal.  Cardiovascular:     Rate and Rhythm: Normal rate and regular rhythm.  Pulmonary:     Effort: Pulmonary effort is normal.     Breath sounds: Normal breath sounds.  Musculoskeletal:        General: Tenderness ( ttp over the laceration site) present. Normal  range of motion.     Comments: The patient has completely preserved range of motion of the thumb including ulnar and radial deviation flexion and extension.  Sensation is slightly decreased at the tip of the thumb, the wound described in the skin section  Skin:    General: Skin is warm and dry.     Comments: Laceration located on right thumb dorsal aspect The Laceration is curved with clean edges shaped The depth is 2 mm The length is 1.5 cm  Neurological:     Mental Status: She is alert.     Coordination: Coordination normal.     Comments: Sensation and motor intact     (all labs ordered are listed, but only abnormal results are displayed) Labs Reviewed - No data to display  EKG: None  Radiology: No results found.  {Document cardiac monitor, telemetry assessment procedure when appropriate:32947} Procedures   Medications Ordered in the ED  lidocaine -EPINEPHrine  (XYLOCAINE  W/EPI) 2 %-1:100000 (with pres) injection 20 mL (has no administration in time range)  Tdap (ADACEL ) injection 0.5 mL (has no administration in time range)      {Click here for ABCD2, HEART and other calculators REFRESH Note before signing:1}  Medical Decision Making Risk Prescription drug management.   Wound closed by APP - see attached note Wound irrigated, tetanus updated, sterile dressing  {Document critical care time when appropriate  Document review of labs and clinical decision tools ie CHADS2VASC2, etc  Document your independent review of radiology images and any outside records  Document your discussion with family members, caretakers and with consultants  Document social determinants of health affecting pt's care  Document your decision making why or why not admission, treatments were needed:32947:::1}   Final diagnoses:  None    ED Discharge Orders     None

## 2024-06-29 NOTE — ED Triage Notes (Signed)
 Pt arrived via POV from work after accidentally lacerating her right thumb. Pt reports she works in the [employer]  and reports it was a box cutter she was using. Pts finger wrapped to control the bleeding PTA.

## 2024-06-29 NOTE — Discharge Instructions (Signed)
 Or ibuprofen  for pain, topical antibiotic such as Neosporin or bacitracin with sterile dressing, keep this clean dry for about 1 week, stitches out in 7 days at your doctor's office or the urgent care  Thank you for allowing us  to treat you in the emergency department today.  After reviewing your examination and potential testing that was done it appears that you are safe to go home.  I would like for you to follow-up with your doctor within the next 7 days, have them obtain your records and follow-up with them to review all potential tests and results from your visit.  If you should develop severe or worsening symptoms return to the emergency department immediately

## 2024-07-07 ENCOUNTER — Emergency Department (HOSPITAL_COMMUNITY)
Admission: EM | Admit: 2024-07-07 | Discharge: 2024-07-07 | Disposition: A | Discharge status: Home / Self Care | Payer: Self-pay | Source: Ambulatory Visit | Attending: Emergency Medicine | Admitting: Emergency Medicine

## 2024-07-07 ENCOUNTER — Other Ambulatory Visit: Payer: Self-pay

## 2024-07-07 DIAGNOSIS — Z4802 Encounter for removal of sutures: Secondary | ICD-10-CM

## 2024-07-07 DIAGNOSIS — S61011D Laceration without foreign body of right thumb without damage to nail, subsequent encounter: Secondary | ICD-10-CM | POA: Insufficient documentation

## 2024-07-07 DIAGNOSIS — Z4800 Encounter for change or removal of nonsurgical wound dressing: Secondary | ICD-10-CM | POA: Insufficient documentation

## 2024-07-07 DIAGNOSIS — X58XXXD Exposure to other specified factors, subsequent encounter: Secondary | ICD-10-CM | POA: Insufficient documentation

## 2024-07-07 NOTE — ED Provider Notes (Signed)
 "  EMERGENCY DEPARTMENT AT [facility] Provider Note   CSN: [id number] Arrival date & time: 07/07/24  9161     Patient presents with: Suture / Staple Removal   Aimee Webb is a 43 y.o. female.  Patient is a 43 year old female who presents to the ED for suture removal to the right thumb.  Notes sutures have been in place for the past 8 days.  She states she has been keeping it covered.  Denies any bleeding or drainage.  No fevers.  No further complaints.    Suture / Staple Removal       Prior to Admission medications  Medication Sig Start Date End Date Taking? Authorizing Provider  albuterol  (VENTOLIN  HFA) 108 (90 Base) MCG/ACT inhaler Inhale 1-2 puffs into the lungs every 6 (six) hours as needed for wheezing or shortness of breath. 06/11/21   Rolinda Rogue, MD  fluticasone  (FLONASE ) 50 MCG/ACT nasal spray Place 1 spray into both nostrils 2 (two) times daily. Point the tip upward and out toward the corner of each eye when you spray 05/23/21   Stuart Vernell Norris, PA-C    Allergies: Codeine    Review of Systems  Skin:  Positive for wound.  All other systems reviewed and are negative.   Updated Vital Signs BP (!) 112/93   Pulse 84   Temp 98.3 F (36.8 C) (Oral)   Resp 16   Ht 5' 5 (1.651 m)   Wt 72.6 kg   LMP 06/29/2018   SpO2 96%   BMI 26.63 kg/m   Physical Exam Cardiovascular:     Pulses: Normal pulses.     Comments: Radial pulse 2+ on the right Skin:    General: Skin is warm and dry.     Comments: 3 to 4 cm laceration present on the dorsal surface of the right thumb over the MCP.  6 sutures in place.  Healing well with no drainage or dehiscence.  No surrounding erythema or signs of cellulitis.  Neurological:     Mental Status: She is oriented to person, place, and time.  Psychiatric:        Mood and Affect: Mood normal.        Behavior: Behavior normal.     (all labs ordered are listed, but only abnormal results are  displayed) Labs Reviewed - No data to display  EKG: None  Radiology: No results found.   Suture Removal  Date/Time: 07/07/2024 9:24 AM  Performed by: Neysa Thersia RAMAN, PA-C Authorized by: Neysa Thersia RAMAN, PA-C   Consent:    Consent given by:  Patient   Risks, benefits, and alternatives were discussed: yes     Risks discussed:  Wound separation and bleeding Universal protocol:    Patient identity confirmed:  Verbally with patient Location:    Location:  Upper extremity   Upper extremity location:  Hand   Hand location:  R thumb Procedure details:    Wound appearance:  No signs of infection and good wound healing   Number of sutures removed:  6 Post-procedure details:    Post-removal:  No dressing applied   Procedure completion:  Tolerated well, no immediate complications    Medications Ordered in the ED - No data to display                                 Medical Decision Making Patient is a 43 year old female who  presents to the ED for suture removal to the right thumb.  Notes sutures were placed approximately 8 days ago.  Tetanus was updated.  She was not placed on antibiotics.  On exam patient is alert and well-appearing.  Physical exam as noted above.  Wound is healing well with no dehiscence or drainage.  6 sutures removed.  No surrounding signs of cellulitis and wound is well-approximated.  Stable for discharge home.  Wound care instructions provided.  Advised PCP follow-up in 1 week for any continued concerns.  Return precautions provided.      Final diagnoses:  Visit for suture removal    ED Discharge Orders     None          Neysa Thersia RAMAN, [place] 07/07/24 9072    Suzette Pac, MD 07/07/24 1715  "

## 2024-07-07 NOTE — ED Triage Notes (Signed)
 Pt presented to ED via POV. Pt request to have sutures removed from right thumb. Appears intact and properly healed. No other complaints verbalized. No pain per pt

## 2024-07-07 NOTE — Discharge Instructions (Signed)
 Avoid picking at the area.  Keep clean and dry.  Follow-up with PCP for any continued concerns.  Return to ED if any symptoms worsen including separation of the wound causing bleeding or any signs of infection.

## 2024-08-23 ENCOUNTER — Other Ambulatory Visit (HOSPITAL_COMMUNITY): Payer: Self-pay | Admitting: Obstetrics & Gynecology

## 2024-08-23 DIAGNOSIS — Z1231 Encounter for screening mammogram for malignant neoplasm of breast: Secondary | ICD-10-CM

## 2024-08-28 ENCOUNTER — Encounter (HOSPITAL_COMMUNITY): Payer: Self-pay

## 2024-08-28 DIAGNOSIS — Z1231 Encounter for screening mammogram for malignant neoplasm of breast: Secondary | ICD-10-CM
